# Patient Record
Sex: Female | Born: 1960 | Race: White | Hispanic: No | Marital: Married | Smoking: Former smoker
Health system: Southern US, Community
[De-identification: ages and names within clinical notes are randomized; demographics above are authoritative.]

## PROBLEM LIST (undated history)

## (undated) DIAGNOSIS — F419 Anxiety disorder, unspecified: Secondary | ICD-10-CM

## (undated) DIAGNOSIS — M1990 Unspecified osteoarthritis, unspecified site: Secondary | ICD-10-CM

## (undated) DIAGNOSIS — I2782 Chronic pulmonary embolism: Secondary | ICD-10-CM

## (undated) HISTORY — PX: APPENDECTOMY: SHX54

## (undated) HISTORY — PX: TONSILLECTOMY: SUR1361

## (undated) HISTORY — PX: PLACEMENT OF BREAST IMPLANTS: SHX6334

## (undated) HISTORY — PX: AUGMENTATION MAMMAPLASTY: SUR837

---

## 2013-03-16 LAB — HM PAP SMEAR: HM PAP: NEGATIVE

## 2014-05-27 ENCOUNTER — Ambulatory Visit (INDEPENDENT_AMBULATORY_CARE_PROVIDER_SITE_OTHER): Payer: BLUE CROSS/BLUE SHIELD

## 2014-05-27 ENCOUNTER — Ambulatory Visit (INDEPENDENT_AMBULATORY_CARE_PROVIDER_SITE_OTHER): Payer: BLUE CROSS/BLUE SHIELD | Admitting: Sports Medicine

## 2014-05-27 ENCOUNTER — Encounter: Payer: Self-pay | Admitting: Sports Medicine

## 2014-05-27 VITALS — BP 101/68 | HR 64 | Ht 68.0 in | Wt 169.0 lb

## 2014-05-27 DIAGNOSIS — M47896 Other spondylosis, lumbar region: Secondary | ICD-10-CM | POA: Diagnosis not present

## 2014-05-27 DIAGNOSIS — M1712 Unilateral primary osteoarthritis, left knee: Secondary | ICD-10-CM | POA: Insufficient documentation

## 2014-05-27 DIAGNOSIS — M51369 Other intervertebral disc degeneration, lumbar region without mention of lumbar back pain or lower extremity pain: Secondary | ICD-10-CM | POA: Insufficient documentation

## 2014-05-27 DIAGNOSIS — M11262 Other chondrocalcinosis, left knee: Secondary | ICD-10-CM

## 2014-05-27 DIAGNOSIS — M25562 Pain in left knee: Secondary | ICD-10-CM

## 2014-05-27 DIAGNOSIS — M5136 Other intervertebral disc degeneration, lumbar region: Secondary | ICD-10-CM

## 2014-05-27 DIAGNOSIS — M11261 Other chondrocalcinosis, right knee: Secondary | ICD-10-CM | POA: Diagnosis not present

## 2014-05-27 DIAGNOSIS — M170 Bilateral primary osteoarthritis of knee: Secondary | ICD-10-CM | POA: Insufficient documentation

## 2014-05-27 MED ORDER — IBUPROFEN 800 MG PO TABS: 800.0000 mg | ORAL_TABLET | Freq: Three times a day (TID) | ORAL | Status: DC | PRN

## 2014-05-27 NOTE — Assessment & Plan Note (Signed)
With discogenic low back pain. Ibuprofen, x-rays, formal PT.

## 2014-05-27 NOTE — Progress Notes (Signed)
   Subjective:    I'm seeing this patient as a consultation for:  Dr. Manson PasseyBrown  CC: Back and knee pain  HPI: Knee pain: Moderate, persistent, localized at the joint line, occasional mechanical symptoms, as well as pain anteriorly. She is a Engineer, civil (consulting)wedding venue owner, and has had some swelling for a while, she does desire interventional treatment today.   Low back pain: Moderate, persistent, discogenic without radiation.  Past medical history, Surgical history, Family history not pertinant except as noted below, Social history, Allergies, and medications have been entered into the medical record, reviewed, and no changes needed.   Review of Systems: No headache, visual changes, nausea, vomiting, diarrhea, constipation, dizziness, abdominal pain, skin rash, fevers, chills, night sweats, weight loss, swollen lymph nodes, body aches, joint swelling, muscle aches, chest pain, shortness of breath, mood changes, visual or auditory hallucinations.   Objective:   General: Well Developed, well nourished, and in no acute distress.  Neuro/Psych: Alert and oriented x3, extra-ocular muscles intact, able to move all 4 extremities, sensation grossly intact. Skin: Warm and dry, no rashes noted.  Respiratory: Not using accessory muscles, speaking in full sentences, trachea midline.  Cardiovascular: Pulses palpable, no extremity edema. Abdomen: Does not appear distended. Left Knee: Visibly swollen with a palpable fluid wave, pain on terminal flexion and with pain at the medial joint line. ROM normal in flexion and extension and lower leg rotation. Ligaments with solid consistent endpoints including ACL, PCL, LCL, MCL. Negative Mcmurray's and provocative meniscal tests. Non painful patellar compression. Patellar and quadriceps tendons unremarkable. Hamstring and quadriceps strength is normal.  Procedure: Real-time Ultrasound Guided Injection of left knee Device: GE Logiq E  Verbal informed consent obtained.    Time-out conducted.  Noted no overlying erythema, induration, or other signs of local infection.  Skin prepped in a sterile fashion.  Local anesthesia: Topical Ethyl chloride.  With sterile technique and under real time ultrasound guidance:  Aspirated 20 mL of straw-colored fluid, syringe switched and 2 mL kenalog 40, 4 mL lidocaine injected easily. Completed without difficulty  Pain immediately resolved suggesting accurate placement of the medication.  Advised to call if fevers/chills, erythema, induration, drainage, or persistent bleeding.  Images permanently stored and available for review in the ultrasound unit.  Impression: Technically successful ultrasound guided injection.  Impression and Recommendations:   This case required medical decision making of moderate complexity.

## 2014-05-27 NOTE — Assessment & Plan Note (Signed)
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 X-rays, ibuprofen, physical therapy, injection as above. Return for custom orthotics.

## 2014-06-10 ENCOUNTER — Encounter: Payer: Self-pay | Admitting: Sports Medicine

## 2014-06-10 ENCOUNTER — Ambulatory Visit (INDEPENDENT_AMBULATORY_CARE_PROVIDER_SITE_OTHER): Payer: 59 | Admitting: Sports Medicine

## 2014-06-10 VITALS — BP 101/70 | HR 63 | Ht 68.0 in | Wt 168.0 lb

## 2014-06-10 DIAGNOSIS — M51369 Other intervertebral disc degeneration, lumbar region without mention of lumbar back pain or lower extremity pain: Secondary | ICD-10-CM

## 2014-06-10 DIAGNOSIS — M5136 Other intervertebral disc degeneration, lumbar region: Secondary | ICD-10-CM | POA: Diagnosis not present

## 2014-06-10 DIAGNOSIS — M25562 Pain in left knee: Secondary | ICD-10-CM | POA: Diagnosis not present

## 2014-06-10 NOTE — Assessment & Plan Note (Signed)
Home rehab given.  return if no better in one month. MRI for intervention.

## 2014-06-10 NOTE — Progress Notes (Signed)

## 2014-06-10 NOTE — Assessment & Plan Note (Signed)
Improved significantly after injection at the last visit. Museum/gallery exhibitions officerCustom Orthotics as above.

## 2014-10-08 ENCOUNTER — Telehealth: Payer: Self-pay | Admitting: Sports Medicine

## 2014-10-08 ENCOUNTER — Encounter: Payer: Self-pay | Admitting: Sports Medicine

## 2014-10-08 ENCOUNTER — Ambulatory Visit (INDEPENDENT_AMBULATORY_CARE_PROVIDER_SITE_OTHER): Payer: 59 | Admitting: Sports Medicine

## 2014-10-08 VITALS — BP 100/69 | HR 75 | Wt 172.0 lb

## 2014-10-08 DIAGNOSIS — M1712 Unilateral primary osteoarthritis, left knee: Secondary | ICD-10-CM | POA: Diagnosis not present

## 2014-10-08 NOTE — Assessment & Plan Note (Signed)
Six-month response to previous injection. Repeat injection today, we will also get her set up for Orthovisc.

## 2014-10-08 NOTE — Telephone Encounter (Signed)
Submitted for approval on Orthovisc. Awaiting confirmation.  

## 2014-10-08 NOTE — Telephone Encounter (Signed)
-----   Message from Monica Becton, MD sent at 10/08/2014  2:12 PM EDT ----- Hello, can we get [facility] approved for Orthovisc, left knee. X-ray confirmed OA, failed steroids injection and NSAIDs. ___________________________________________ Ihor Austin. Benjamin Stain, M.D., ABFM., CAQSM. Primary Care and Sports Medicine [facility]  Adjunct Instructor of Family Medicine  [employer]

## 2014-10-08 NOTE — Progress Notes (Signed)
  Subjective:    CC: Left knee pain  HPI: Left knee osteoarthritis: Extremely well after steroidal injection 6 months ago, now with recurrence of medial joint line pain. Moderate, persistent without radiation, or mechanical symptoms. No swelling. No trauma. Desires repeat intervention.  Past medical history, Surgical history, Family history not pertinant except as noted below, Social history, Allergies, and medications have been entered into the medical record, reviewed, and no changes needed.   Review of Systems: No fevers, chills, night sweats, weight loss, chest pain, or shortness of breath.   Objective:    General: Well Developed, well nourished, and in no acute distress.  Neuro: Alert and oriented x3, extra-ocular muscles intact, sensation grossly intact.  HEENT: Normocephalic, atraumatic, pupils equal round reactive to light, neck supple, no masses, no lymphadenopathy, thyroid nonpalpable.  Skin: Warm and dry, no rashes. Cardiac: Regular rate and rhythm, no murmurs rubs or gallops, no lower extremity edema.  Respiratory: Clear to auscultation bilaterally. Not using accessory muscles, speaking in full sentences. Left Knee: Normal to inspection with no erythema or effusion or obvious bony abnormalities. Tender to palpation at the medial joint line ROM normal in flexion and extension and lower leg rotation. Ligaments with solid consistent endpoints including ACL, PCL, LCL, MCL. Negative Mcmurray's and provocative meniscal tests. Non painful patellar compression. Patellar and quadriceps tendons unremarkable. Hamstring and quadriceps strength is normal.  Procedure: Real-time Ultrasound Guided Injection of left knee Device: GE Logiq E  Verbal informed consent obtained.  Time-out conducted.  Noted no overlying erythema, induration, or other signs of local infection.  Skin prepped in a sterile fashion.  Local anesthesia: Topical Ethyl chloride.  With sterile technique and under real  time ultrasound guidance:  2 mL kenalog 40, 4 mL lidocaine injected easily Completed without difficulty  Pain immediately resolved suggesting accurate placement of the medication.  Advised to call if fevers/chills, erythema, induration, drainage, or persistent bleeding.  Images permanently stored and available for review in the ultrasound unit.  Impression: Technically successful ultrasound guided injection.  Impression and Recommendations:

## 2014-10-12 NOTE — Telephone Encounter (Signed)
Received information back from [facility]:  PATIENT HAS [facility] EXCHANGE HMO PLAN WITH AN EFFECTIVE DATE OF 03/30/2014. SUBMIT PCP REFERRAL FOR INITIAL VISIT WITH CLAIM. MEDICAL NOTES MUST BE SUBMITTED WITH THE CLAIM. INCLUDE MEDICAL NECESSITY, DIAGNOSIS, AND ALL CLINICAL. DOCTOR MAY NOT BUY AND BILL, MEDICATION MUST BE OBTAINED THROUGH THE SPECIALTY PHARMACY. PLEASE CONTACT THE ORTHOVISC LINE IF YOU WOULD LIKE MEDICATION TRANSFERRED TO SPECIALTY PHARMACY. [id number] IS COVERED AT 70% OF THE CONTRACTED RATE WHEN PERFORMED IN AN OFFICE SETTING. *DEDUCTIBLE MUST BE MET FOR COVERAGE TO APPLY. IF OUT OF POCKET IS MET, COVERAGE GOES TO 100%. Reference: [id number]  [facility] has received the Rx request and is processing it. Will contact our office with any PA requirements.  Medical notes have been faxed to [employer] at F: [phone]. Will await pre-determination.

## 2014-10-21 NOTE — Telephone Encounter (Addendum)
Received fax from [employer] requesting CPT codes. The following codes will be associated with each injection administration (Left knee only = 4 total).  CPT code: 20610 Rx code: [id number] DX: M17.12  Will fax information to [employer]. 

## 2014-12-10 ENCOUNTER — Ambulatory Visit (INDEPENDENT_AMBULATORY_CARE_PROVIDER_SITE_OTHER): Payer: 59 | Admitting: Sports Medicine

## 2014-12-10 ENCOUNTER — Encounter: Payer: Self-pay | Admitting: Sports Medicine

## 2014-12-10 VITALS — BP 104/65 | HR 63 | Wt 171.0 lb

## 2014-12-10 DIAGNOSIS — F39 Unspecified mood [affective] disorder: Secondary | ICD-10-CM

## 2014-12-10 DIAGNOSIS — Z0000 Encounter for general adult medical examination without abnormal findings: Secondary | ICD-10-CM | POA: Diagnosis not present

## 2014-12-10 DIAGNOSIS — M1712 Unilateral primary osteoarthritis, left knee: Secondary | ICD-10-CM

## 2014-12-10 MED ORDER — MELOXICAM 15 MG PO TABS: ORAL_TABLET | ORAL | Status: DC

## 2014-12-10 MED ORDER — SERTRALINE HCL 25 MG PO TABS: 25.0000 mg | ORAL_TABLET | Freq: Every day | ORAL | Status: DC

## 2014-12-10 MED ORDER — TRAMADOL HCL 50 MG PO TABS: ORAL_TABLET | ORAL | Status: DC

## 2014-12-10 NOTE — Progress Notes (Signed)
  Subjective:    CC: Establish care  HPI: Preventive measures: Up-to-date on cervical, breast, colon cancer screening, needs routine blood work.  Left knee osteoarthritis: Minimal response to the previous injection, Orthovisc was not affordable, has not tried any NSAIDs.  Mood disorder: stable on Zoloft, needs a refill.  Past medical history, Surgical history, Family history not pertinant except as noted below, Social history, Allergies, and medications have been entered into the medical record, reviewed, and no changes needed.   Review of Systems: No fevers, chills, night sweats, weight loss, chest pain, or shortness of breath.   Objective:    General: Well Developed, well nourished, and in no acute distress.  Neuro: Alert and oriented x3, extra-ocular muscles intact, sensation grossly intact.  HEENT: Normocephalic, atraumatic, pupils equal round reactive to light, neck supple, no masses, no lymphadenopathy, thyroid nonpalpable.  Skin: Warm and dry, no rashes. Cardiac: Regular rate and rhythm, no murmurs rubs or gallops, no lower extremity edema.  Respiratory: Clear to auscultation bilaterally. Not using accessory muscles, speaking in full sentences.  Impression and Recommendations:    I spent 25 minutes with this patient, greater than 50% was face-to-face time counseling regarding the above diagnoses

## 2014-12-10 NOTE — Assessment & Plan Note (Signed)
Previous injection was in September, now starting to have a recurrence of pain, not taking any NSAIDs, adding meloxicam. Visco supplementation with Orthovisc is approved but only after deductible is met.

## 2014-12-10 NOTE — Assessment & Plan Note (Signed)
Continue sertraline 

## 2014-12-10 NOTE — Assessment & Plan Note (Signed)
Ordering routine blood work. Up-to-date on cervical, breast, colon cancer screening.

## 2014-12-13 ENCOUNTER — Telehealth: Payer: Self-pay | Admitting: Sports Medicine

## 2014-12-13 DIAGNOSIS — F39 Unspecified mood [affective] disorder: Secondary | ICD-10-CM

## 2014-12-13 MED ORDER — SERTRALINE HCL 25 MG PO TABS: 25.0000 mg | ORAL_TABLET | Freq: Every day | ORAL | Status: DC

## 2014-12-13 NOTE — Telephone Encounter (Signed)
Please let Vanessa DandyMary know that [facility] cant fill her scripts until she signs up for mail order.  Then I can start sending it there, until then she needs to simply get her zoloft from her local pharmacy.

## 2014-12-13 NOTE — Telephone Encounter (Signed)
Pt advised. States she is fine to pick up Rx at local pharmacy for now. Advised to let us know if she signs up for mail order and we can adjust future refills accordingly. No further questions.

## 2015-03-02 ENCOUNTER — Other Ambulatory Visit: Payer: Self-pay | Admitting: Sports Medicine

## 2015-03-02 DIAGNOSIS — Z1239 Encounter for other screening for malignant neoplasm of breast: Secondary | ICD-10-CM

## 2015-03-11 ENCOUNTER — Telehealth: Payer: Self-pay | Admitting: Sports Medicine

## 2015-03-11 DIAGNOSIS — W57XXXA Bitten or stung by nonvenomous insect and other nonvenomous arthropods, initial encounter: Secondary | ICD-10-CM

## 2015-03-11 DIAGNOSIS — Z8619 Personal history of other infectious and parasitic diseases: Secondary | ICD-10-CM | POA: Insufficient documentation

## 2015-03-11 MED ORDER — DOXYCYCLINE HYCLATE 100 MG PO TABS: 100.0000 mg | ORAL_TABLET | Freq: Two times a day (BID) | ORAL | Status: DC

## 2015-03-11 NOTE — Telephone Encounter (Signed)
Pt is visiting her sister out of town and had a deer tick removed this am. Pt unsure how long it was attached but it was embedded under the skin. There is a small red ring around the area. No current fever. Pt does report a headache. Pt questions if she should start on an antibiotic, will route to PCP for review. Pt would like to use [facility] on [address], [place] since she is out of town.

## 2015-03-11 NOTE — Telephone Encounter (Signed)
Adding 14 days of doxycycline, also placed orders for [facility], Lyme, and human erlichiosis titers for when she gets back.

## 2015-03-11 NOTE — Telephone Encounter (Signed)
Pt advised of new Rx and lab orders. States she will start the Rx today and she will get her labs done next week when she gets home.

## 2015-03-15 ENCOUNTER — Ambulatory Visit (INDEPENDENT_AMBULATORY_CARE_PROVIDER_SITE_OTHER): Payer: 59 | Admitting: Sports Medicine

## 2015-03-15 ENCOUNTER — Encounter: Payer: Self-pay | Admitting: Sports Medicine

## 2015-03-15 VITALS — BP 128/88 | HR 87 | Temp 97.7°F | Resp 18 | Wt 173.2 lb

## 2015-03-15 DIAGNOSIS — W57XXXA Bitten or stung by nonvenomous insect and other nonvenomous arthropods, initial encounter: Secondary | ICD-10-CM | POA: Diagnosis not present

## 2015-03-15 DIAGNOSIS — T148 Other injury of unspecified body region: Secondary | ICD-10-CM

## 2015-03-15 LAB — CBC WITH DIFFERENTIAL/PLATELET
Basophils Absolute: 0 K/uL (ref 0.0–0.1)
Basophils Relative: 1 % (ref 0–1)
Eosinophils Absolute: 0 10*3/uL (ref 0.0–0.7)
Eosinophils Relative: 1 % (ref 0–5)
HCT: 38.4 % (ref 36.0–46.0)
Hemoglobin: 12.8 g/dL (ref 12.0–15.0)
Lymphocytes Relative: 45 % (ref 12–46)
Lymphs Abs: 1.8 K/uL (ref 0.7–4.0)
MCH: 31 pg (ref 26.0–34.0)
MCHC: 33.3 g/dL (ref 30.0–36.0)
MCV: 93 fL (ref 78.0–100.0)
MPV: 10.2 fL (ref 8.6–12.4)
Monocytes Absolute: 0.4 10*3/uL (ref 0.1–1.0)
Monocytes Relative: 9 % (ref 3–12)
Neutro Abs: 1.7 K/uL (ref 1.7–7.7)
Neutrophils Relative %: 44 % (ref 43–77)
Platelets: 229 K/uL (ref 150–400)
RBC: 4.13 MIL/uL (ref 3.87–5.11)
RDW: 14.4 % (ref 11.5–15.5)
WBC: 3.9 10*3/uL — ABNORMAL LOW (ref 4.0–10.5)

## 2015-03-15 LAB — COMPREHENSIVE METABOLIC PANEL WITH GFR
AST: 24 U/L (ref 10–35)
CO2: 29 mmol/L (ref 20–31)
Calcium: 9.4 mg/dL (ref 8.6–10.4)
Chloride: 104 mmol/L (ref 98–110)
Glucose, Bld: 96 mg/dL (ref 65–99)
Sodium: 143 mmol/L (ref 135–146)
Total Bilirubin: 0.4 mg/dL (ref 0.2–1.2)

## 2015-03-15 LAB — COMPREHENSIVE METABOLIC PANEL
ALT: 28 U/L (ref 6–29)
Albumin: 4.3 g/dL (ref 3.6–5.1)
Alkaline Phosphatase: 61 U/L (ref 33–130)
BUN: 14 mg/dL (ref 7–25)
Creat: 0.6 mg/dL (ref 0.50–1.05)
Potassium: 4 mmol/L (ref 3.5–5.3)
Total Protein: 6.7 g/dL (ref 6.1–8.1)

## 2015-03-15 LAB — LYME AB/WESTERN BLOT REFLEX: B burgdorferi Ab IgG+IgM: 0.53 {ISR}

## 2015-03-15 MED ORDER — DOXYCYCLINE HYCLATE 100 MG PO TABS: 100.0000 mg | ORAL_TABLET | Freq: Two times a day (BID) | ORAL | Status: AC

## 2015-03-15 NOTE — Progress Notes (Signed)
  Subjective:    CC: tick bite  HPI: Couple weeks ago this pleasant 55 year old female had a tick removed from her right posterior back, she developed a rash, and possibly some myalgias, we started her on doxycycline and added various tickborne titers. Overall she feels better. Symptoms are mild, improving  Past medical history, Surgical history, Family history not pertinant except as noted below, Social history, Allergies, and medications have been entered into the medical record, reviewed, and no changes needed.   Review of Systems: No fevers, chills, night sweats, weight loss, chest pain, or shortness of breath.   Objective:    General: Well Developed, well nourished, and in no acute distress.  Neuro: Alert and oriented x3, extra-ocular muscles intact, sensation grossly intact.  HEENT: Normocephalic, atraumatic, pupils equal round reactive to light, neck supple, no masses, no lymphadenopathy, thyroid nonpalpable.  Skin: Warm and dry, no rashes.there is a small superficially ulcerated papule with mild surrounding erythema on the right side of her back without any evidence of a erythema chronicum migrans lesion. Cardiac: Regular rate and rhythm, no murmurs rubs or gallops, no lower extremity edema.  Respiratory: Clear to auscultation bilaterally. Not using accessory muscles, speaking in full sentences.  Impression and Recommendations:

## 2015-03-15 NOTE — Assessment & Plan Note (Signed)
Finishing doxycycline, Lyme titers are negative, awaiting [facility] and human erlichiosis titers.

## 2015-03-16 LAB — EHRLICHIA ANTIBODY PANEL
E chaffeensis (HGE) Ab, IgG: <1:64 {titer}
E chaffeensis (HGE) Ab, IgM: <1:20 {titer}

## 2015-03-23 ENCOUNTER — Telehealth: Payer: Self-pay | Admitting: Sports Medicine

## 2015-03-23 LAB — ROCKY MTN SPOTTED FVR ABS PNL(IGG+IGM)
RMSF IgG: 0.55 IV
RMSF IgM: 2.22 IV

## 2015-03-23 NOTE — Telephone Encounter (Signed)
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

## 2015-03-24 ENCOUNTER — Encounter: Payer: Self-pay | Admitting: Sports Medicine

## 2015-03-30 ENCOUNTER — Ambulatory Visit (INDEPENDENT_AMBULATORY_CARE_PROVIDER_SITE_OTHER): Payer: 59

## 2015-03-30 DIAGNOSIS — Z1231 Encounter for screening mammogram for malignant neoplasm of breast: Secondary | ICD-10-CM

## 2015-03-30 DIAGNOSIS — Z1239 Encounter for other screening for malignant neoplasm of breast: Secondary | ICD-10-CM

## 2015-04-12 ENCOUNTER — Other Ambulatory Visit: Payer: Self-pay | Admitting: Sports Medicine

## 2015-08-04 ENCOUNTER — Encounter: Payer: Self-pay | Admitting: Sports Medicine

## 2015-08-04 ENCOUNTER — Ambulatory Visit (INDEPENDENT_AMBULATORY_CARE_PROVIDER_SITE_OTHER): Payer: BLUE CROSS/BLUE SHIELD | Admitting: Sports Medicine

## 2015-08-04 VITALS — BP 104/70 | HR 62 | Ht 68.0 in | Wt 177.0 lb

## 2015-08-04 DIAGNOSIS — M1712 Unilateral primary osteoarthritis, left knee: Secondary | ICD-10-CM

## 2015-08-04 NOTE — Progress Notes (Signed)
  Subjective:    CC: left knee pain  HPI: His is a pleasant 55 year old female with known left knee osteoarthritis, previous injection provided 10 months of relief,desires repeat injection, pain is moderate, persistent, localized at the joint line without mechanical symptoms, only minimal swelling.  Past medical history, Surgical history, Family history not pertinant except as noted below, Social history, Allergies, and medications have been entered into the medical record, reviewed, and no changes needed.   Review of Systems: No fevers, chills, night sweats, weight loss, chest pain, or shortness of breath.   Objective:    General: Well Developed, well nourished, and in no acute distress.  Neuro: Alert and oriented x3, extra-ocular muscles intact, sensation grossly intact.  HEENT: Normocephalic, atraumatic, pupils equal round reactive to light, neck supple, no masses, no lymphadenopathy, thyroid nonpalpable.  Skin: Warm and dry, no rashes. Cardiac: Regular rate and rhythm, no murmurs rubs or gallops, no lower extremity edema.  Respiratory: Clear to auscultation bilaterally. Not using accessory muscles, speaking in full sentences. Left Knee: Normal to inspection with no erythema or effusion or obvious bony abnormalities. Tender to palpation at the medial joint line ROM normal in flexion and extension and lower leg rotation. Ligaments with solid consistent endpoints including ACL, PCL, LCL, MCL. Negative Mcmurray's and provocative meniscal tests. Non painful patellar compression. Patellar and quadriceps tendons unremarkable. Hamstring and quadriceps strength is normal.  Procedure: Real-time Ultrasound Guided Injection of left knee Device: GE Logiq E  Verbal informed consent obtained.  Time-out conducted.  Noted no overlying erythema, induration, or other signs of local infection.  Skin prepped in a sterile fashion.  Local anesthesia: Topical Ethyl chloride.  With sterile technique  and under real time ultrasound guidance:  1 mL kenalog 40, 2 mL lidocaine, 2 mL Marcaine injected easily into the suprapatellar recess. Completed without difficulty  Pain immediately resolved suggesting accurate placement of the medication.  Advised to call if fevers/chills, erythema, induration, drainage, or persistent bleeding.  Images permanently stored and available for review in the ultrasound unit.  Impression: Technically successful ultrasound guided injection.  Impression and Recommendations:

## 2015-08-04 NOTE — Assessment & Plan Note (Signed)
5817-month response to previous injection, repeat steroid injection today, return as needed.

## 2015-08-14 ENCOUNTER — Other Ambulatory Visit: Payer: Self-pay | Admitting: Sports Medicine

## 2015-09-19 ENCOUNTER — Other Ambulatory Visit: Payer: Self-pay

## 2015-09-19 DIAGNOSIS — F39 Unspecified mood [affective] disorder: Secondary | ICD-10-CM

## 2015-09-19 MED ORDER — SERTRALINE HCL 25 MG PO TABS: 25.0000 mg | ORAL_TABLET | Freq: Every day | ORAL | 3 refills | Status: DC

## 2015-09-19 MED ORDER — MELOXICAM 15 MG PO TABS: 15.0000 mg | ORAL_TABLET | Freq: Every day | ORAL | 1 refills | Status: DC

## 2015-10-21 ENCOUNTER — Telehealth: Payer: Self-pay

## 2015-10-21 MED ORDER — PREDNISONE 50 MG PO TABS: ORAL_TABLET | ORAL | 0 refills | Status: DC

## 2015-10-21 MED ORDER — CYCLOBENZAPRINE HCL 10 MG PO TABS: ORAL_TABLET | ORAL | 0 refills | Status: DC

## 2015-10-21 NOTE — Telephone Encounter (Signed)
Sending in 5 days of prednisone and Flexeril.

## 2015-10-21 NOTE — Telephone Encounter (Signed)
Vanessa Snyder called and states she was moving heavy rocks and now she has back muscle pain. She is working all weekend on a wedding and [facility]. Please advise.

## 2015-10-24 NOTE — Telephone Encounter (Signed)
Patient aware.

## 2016-03-05 ENCOUNTER — Other Ambulatory Visit: Payer: Self-pay | Admitting: Sports Medicine

## 2016-03-05 DIAGNOSIS — Z1231 Encounter for screening mammogram for malignant neoplasm of breast: Secondary | ICD-10-CM

## 2016-03-22 ENCOUNTER — Ambulatory Visit: Payer: BLUE CROSS/BLUE SHIELD | Admitting: Sports Medicine

## 2016-03-23 ENCOUNTER — Other Ambulatory Visit: Payer: Self-pay | Admitting: Sports Medicine

## 2016-04-06 ENCOUNTER — Ambulatory Visit (INDEPENDENT_AMBULATORY_CARE_PROVIDER_SITE_OTHER): Payer: BLUE CROSS/BLUE SHIELD | Admitting: Sports Medicine

## 2016-04-06 ENCOUNTER — Ambulatory Visit (INDEPENDENT_AMBULATORY_CARE_PROVIDER_SITE_OTHER): Payer: BLUE CROSS/BLUE SHIELD

## 2016-04-06 ENCOUNTER — Encounter: Payer: Self-pay | Admitting: Sports Medicine

## 2016-04-06 DIAGNOSIS — Z1231 Encounter for screening mammogram for malignant neoplasm of breast: Secondary | ICD-10-CM | POA: Diagnosis not present

## 2016-04-06 DIAGNOSIS — Z01419 Encounter for gynecological examination (general) (routine) without abnormal findings: Secondary | ICD-10-CM | POA: Diagnosis not present

## 2016-04-06 DIAGNOSIS — Z0000 Encounter for general adult medical examination without abnormal findings: Secondary | ICD-10-CM | POA: Diagnosis not present

## 2016-04-06 DIAGNOSIS — F39 Unspecified mood [affective] disorder: Secondary | ICD-10-CM

## 2016-04-06 DIAGNOSIS — M1712 Unilateral primary osteoarthritis, left knee: Secondary | ICD-10-CM | POA: Diagnosis not present

## 2016-04-06 DIAGNOSIS — Z6828 Body mass index (BMI) 28.0-28.9, adult: Secondary | ICD-10-CM | POA: Diagnosis not present

## 2016-04-06 DIAGNOSIS — R1310 Dysphagia, unspecified: Secondary | ICD-10-CM | POA: Diagnosis not present

## 2016-04-06 LAB — HM PAP SMEAR

## 2016-04-06 MED ORDER — SERTRALINE HCL 25 MG PO TABS: 25.0000 mg | ORAL_TABLET | Freq: Every day | ORAL | 3 refills | Status: DC

## 2016-04-06 NOTE — Assessment & Plan Note (Signed)
With globus sensation, unclear etiology, sensation of food getting stuck. Referral to gastroenterology for consideration of swallowing studies as well as upper endoscopy.

## 2016-04-06 NOTE — Assessment & Plan Note (Signed)
Extremely well-controlled, refilling Zoloft.

## 2016-04-06 NOTE — Assessment & Plan Note (Signed)
Very well controlled after injection several months ago, only takes 7.5 mg of meloxicam daily.

## 2016-04-06 NOTE — Assessment & Plan Note (Signed)
Up-to-date on screening measures, checking routine blood work.

## 2016-04-06 NOTE — Progress Notes (Signed)
  Subjective:    CC: Follow-up  HPI: This is a pleasant 56 year old female, she is here for follow-up of multiple issues.  Depression: Well controlled with 25 of sertraline.  Knee osteoarthritis: Controlled after injection and 7.5 mg of meloxicam daily.  Dysphagia/globus sensation: Feels as though something is stuck in her throat when she swallows. Minimal halitosis, no sour brash, no weight loss. No hematochezia, melena, hematemesis.  Past medical history:  Negative.  See flowsheet/record as well for more information.  Surgical history: Negative.  See flowsheet/record as well for more information.  Family history: Negative.  See flowsheet/record as well for more information.  Social history: Negative.  See flowsheet/record as well for more information.  Allergies, and medications have been entered into the medical record, reviewed, and no changes needed.   Review of Systems: No fevers, chills, night sweats, weight loss, chest pain, or shortness of breath.   Objective:    General: Well Developed, well nourished, and in no acute distress.  Neuro: Alert and oriented x3, extra-ocular muscles intact, sensation grossly intact.  HEENT: Normocephalic, atraumatic, pupils equal round reactive to light, neck supple, no masses, no lymphadenopathy, thyroid nonpalpable.  Skin: Warm and dry, no rashes. Cardiac: Regular rate and rhythm, no murmurs rubs or gallops, no lower extremity edema.  Respiratory: Clear to auscultation bilaterally. Not using accessory muscles, speaking in full sentences. Abdomen: Soft, nontender, nondistended, normal bowel sounds, no palpable masses, no guarding, rigidity, rebound tenderness.  Impression and Recommendations:    Annual physical exam Up-to-date on screening measures, checking routine blood work.  Mood disorder (HCC) Extremely well-controlled, refilling Zoloft.  Primary osteoarthritis of left knee Very well controlled after injection several months ago,  only takes 7.5 mg of meloxicam daily.  Dysphagia With globus sensation, unclear etiology, sensation of food getting stuck. Referral to gastroenterology for consideration of swallowing studies as well as upper endoscopy.

## 2016-04-09 ENCOUNTER — Encounter: Payer: Self-pay | Admitting: Sports Medicine

## 2016-04-26 DIAGNOSIS — Z0000 Encounter for general adult medical examination without abnormal findings: Secondary | ICD-10-CM | POA: Diagnosis not present

## 2016-04-26 LAB — CBC
HCT: 40.7 % (ref 35.0–45.0)
Hemoglobin: 13.1 g/dL (ref 11.7–15.5)
MCH: 31.1 pg (ref 27.0–33.0)
MCHC: 32.2 g/dL (ref 32.0–36.0)
MCV: 96.7 fL (ref 80.0–100.0)
MPV: 10.1 fL (ref 7.5–12.5)
Platelets: 296 10*3/uL (ref 140–400)
RBC: 4.21 MIL/uL (ref 3.80–5.10)
RDW: 13.8 % (ref 11.0–15.0)
WBC: 3.9 10*3/uL (ref 3.8–10.8)

## 2016-04-27 LAB — COMPREHENSIVE METABOLIC PANEL WITH GFR
ALT: 25 U/L (ref 6–29)
AST: 24 U/L (ref 10–35)
Alkaline Phosphatase: 61 U/L (ref 33–130)
BUN: 12 mg/dL (ref 7–25)
Chloride: 104 mmol/L (ref 98–110)
Creat: 0.67 mg/dL (ref 0.50–1.05)
Sodium: 142 mmol/L (ref 135–146)
Total Protein: 6.8 g/dL (ref 6.1–8.1)

## 2016-04-27 LAB — LIPID PANEL W/REFLEX DIRECT LDL
Cholesterol: 175 mg/dL (ref <200)
HDL: 70 mg/dL (ref >50)
LDL-Cholesterol: 92 mg/dL
Non-HDL Cholesterol (Calc): 105 mg/dL (ref <130)
Total CHOL/HDL Ratio: 2.5 Ratio (ref <5.0)
Triglycerides: 51 mg/dL (ref <150)

## 2016-04-27 LAB — TSH: TSH: 1.31 mIU/L

## 2016-04-27 LAB — VITAMIN D 25 HYDROXY (VIT D DEFICIENCY, FRACTURES): Vit D, 25-Hydroxy: 39 ng/mL (ref 30–100)

## 2016-04-27 LAB — COMPREHENSIVE METABOLIC PANEL
Albumin: 4.1 g/dL (ref 3.6–5.1)
CO2: 28 mmol/L (ref 20–31)
Calcium: 9.4 mg/dL (ref 8.6–10.4)
Glucose, Bld: 86 mg/dL (ref 65–99)
Potassium: 4.3 mmol/L (ref 3.5–5.3)
Total Bilirubin: 0.5 mg/dL (ref 0.2–1.2)

## 2016-04-27 LAB — HIV ANTIBODY (ROUTINE TESTING W REFLEX): HIV 1&2 Ab, 4th Generation: NONREACTIVE

## 2016-04-27 LAB — HEMOGLOBIN A1C
Hgb A1c MFr Bld: 5.1 % (ref <5.7)
Mean Plasma Glucose: 100 mg/dL

## 2016-04-27 LAB — HEPATITIS C ANTIBODY: HCV Ab: NEGATIVE

## 2016-09-10 DIAGNOSIS — H524 Presbyopia: Secondary | ICD-10-CM | POA: Diagnosis not present

## 2016-09-16 ENCOUNTER — Other Ambulatory Visit: Payer: Self-pay | Admitting: Sports Medicine

## 2016-12-14 ENCOUNTER — Other Ambulatory Visit: Payer: Self-pay | Admitting: Sports Medicine

## 2017-02-26 ENCOUNTER — Other Ambulatory Visit: Payer: Self-pay | Admitting: Sports Medicine

## 2017-02-26 DIAGNOSIS — Z1239 Encounter for other screening for malignant neoplasm of breast: Secondary | ICD-10-CM

## 2017-04-19 ENCOUNTER — Other Ambulatory Visit: Payer: Self-pay | Admitting: Sports Medicine

## 2017-04-19 ENCOUNTER — Ambulatory Visit (INDEPENDENT_AMBULATORY_CARE_PROVIDER_SITE_OTHER): Payer: BLUE CROSS/BLUE SHIELD

## 2017-04-19 DIAGNOSIS — Z1231 Encounter for screening mammogram for malignant neoplasm of breast: Secondary | ICD-10-CM | POA: Diagnosis not present

## 2017-04-19 DIAGNOSIS — Z1239 Encounter for other screening for malignant neoplasm of breast: Secondary | ICD-10-CM

## 2017-04-19 DIAGNOSIS — Z6827 Body mass index (BMI) 27.0-27.9, adult: Secondary | ICD-10-CM | POA: Diagnosis not present

## 2017-04-19 DIAGNOSIS — Z01419 Encounter for gynecological examination (general) (routine) without abnormal findings: Secondary | ICD-10-CM | POA: Diagnosis not present

## 2017-06-05 ENCOUNTER — Ambulatory Visit (INDEPENDENT_AMBULATORY_CARE_PROVIDER_SITE_OTHER): Payer: BLUE CROSS/BLUE SHIELD | Admitting: Sports Medicine

## 2017-06-05 DIAGNOSIS — R1310 Dysphagia, unspecified: Secondary | ICD-10-CM

## 2017-06-05 DIAGNOSIS — Z0000 Encounter for general adult medical examination without abnormal findings: Secondary | ICD-10-CM

## 2017-06-05 DIAGNOSIS — Z8619 Personal history of other infectious and parasitic diseases: Secondary | ICD-10-CM | POA: Diagnosis not present

## 2017-06-05 DIAGNOSIS — M1712 Unilateral primary osteoarthritis, left knee: Secondary | ICD-10-CM | POA: Diagnosis not present

## 2017-06-05 DIAGNOSIS — F39 Unspecified mood [affective] disorder: Secondary | ICD-10-CM | POA: Diagnosis not present

## 2017-06-05 MED ORDER — DICLOFENAC SODIUM 2 % TD SOLN: 2.0000 | Freq: Two times a day (BID) | TRANSDERMAL | 11 refills | Status: DC

## 2017-06-05 MED ORDER — COSAMIN ASU ADVANCED FORMULA PO CAPS: 2.0000 | ORAL_CAPSULE | Freq: Two times a day (BID) | ORAL | 11 refills | Status: DC

## 2017-06-05 NOTE — Assessment & Plan Note (Signed)
Previous injection was over 1 year ago, repeat bilateral injections. She is only taking one half of the meloxicam tab, she can go up to a full tab. Return as needed for this.

## 2017-06-05 NOTE — Progress Notes (Signed)
Subjective:    CC: Bilateral knee pain  HPI: Vanessa Snyder is a pleasant 57 year old female, she has known knee osteoarthritis, previous injection was over a year ago, having a recurrence of pain, only doing half of the meloxicam.  She desires repeat injections, pain is moderate, persistent, localized to the medial joint lines without radiation, no mechanical symptoms, no trauma.  In addition she is noted increase in her fatigue, she does have a history of depression but feels happy, she sleeps well and wakes well rested.  Did have a few additional tick bites.  Would like some routine blood work.  Last but not least she has noted some difficulty swallowing, mostly solid foods, she does tend to take large bites and not chew appropriately.  Last year we did a referral to gastroenterology, she did not follow through with this.  She understands that if with changing her eating habits, smaller bites, better chewing, taking each bite with water if this does not help we will proceed with modified barium swallow.  I reviewed the past medical history, family history, social history, surgical history, and allergies today and no changes were needed.  Please see the problem list section below in epic for further details.  Past Medical History: No past medical history on file. Past Surgical History: Past Surgical History:  Procedure Laterality Date  . AUGMENTATION MAMMAPLASTY    . CESAREAN SECTION    . PLACEMENT OF BREAST IMPLANTS     Social History: Social History   Socioeconomic History  . Marital status: Married    Spouse name: Not on file  . Number of children: Not on file  . Years of education: Not on file  . Highest education level: Not on file  Occupational History  . Not on file  Social Needs  . Financial resource strain: Not on file  . Food insecurity:    Worry: Not on file    Inability: Not on file  . Transportation needs:    Medical: Not on file    Non-medical: Not on file  Tobacco  Use  . Smoking status: Never Smoker  . Smokeless tobacco: Never Used  Substance and Sexual Activity  . Alcohol use: Not on file  . Drug use: Not on file  . Sexual activity: Not on file  Lifestyle  . Physical activity:    Days per week: Not on file    Minutes per session: Not on file  . Stress: Not on file  Relationships  . Social connections:    Talks on phone: Not on file    Gets together: Not on file    Attends religious service: Not on file    Active member of club or organization: Not on file    Attends meetings of clubs or organizations: Not on file    Relationship status: Not on file  Other Topics Concern  . Not on file  Social History Narrative  . Not on file   Family History: No family history on file. Allergies: No Known Allergies Medications: See med rec.  Review of Systems: No fevers, chills, night sweats, weight loss, chest pain, or shortness of breath.   Objective:    General: Well Developed, well nourished, and in no acute distress.  Neuro: Alert and oriented x3, extra-ocular muscles intact, sensation grossly intact.  HEENT: Normocephalic, atraumatic, pupils equal round reactive to light, neck supple, no masses, no lymphadenopathy, thyroid nonpalpable.  Skin: Warm and dry, no rashes. Cardiac: Regular rate and rhythm, no murmurs rubs  or gallops, no lower extremity edema.  Respiratory: Clear to auscultation bilaterally. Not using accessory muscles, speaking in full sentences.  Procedure: Real-time Ultrasound Guided Injection of left knee Device: GE Logiq E  Verbal informed consent obtained.  Time-out conducted.  Noted no overlying erythema, induration, or other signs of local infection.  Skin prepped in a sterile fashion.  Local anesthesia: Topical Ethyl chloride.  With sterile technique and under real time ultrasound guidance: 1 cc Kenalog 40, 2 cc lidocaine, 2 cc bupivacaine injected easily Completed without difficulty  Pain immediately resolved  suggesting accurate placement of the medication.  Advised to call if fevers/chills, erythema, induration, drainage, or persistent bleeding.  Images permanently stored and available for review in the ultrasound unit.  Impression: Technically successful ultrasound guided injection.  Procedure: Real-time Ultrasound Guided Injection of right knee Device: GE Logiq E  Verbal informed consent obtained.  Time-out conducted.  Noted no overlying erythema, induration, or other signs of local infection.  Skin prepped in a sterile fashion.  Local anesthesia: Topical Ethyl chloride.  With sterile technique and under real time ultrasound guidance: 1 cc Kenalog 40, 2 cc lidocaine, 2 cc bupivacaine injected easily Completed without difficulty  Pain immediately resolved suggesting accurate placement of the medication.  Advised to call if fevers/chills, erythema, induration, drainage, or persistent bleeding.  Images permanently stored and available for review in the ultrasound unit.  Impression: Technically successful ultrasound guided injection.  Impression and Recommendations:    Primary osteoarthritis of left knee Previous injection was over 1 year ago, repeat bilateral injections. She is only taking one half of the meloxicam tab, she can go up to a full tab. Return as needed for this.  H/O [facility] spotted fever Increasing fatigue, rechecking tickborne serologies.  Dysphagia Continues to have difficulty swallowing, sensation of food getting stuck. She will try smaller bites, water mixed in. If persistent difficulty swallowing we will refer her to speech-language pathology and gastroenterology. We did discuss this 1 year ago.  Mood disorder (HCC) Continues to do well on Zoloft  Annual physical exam Rechecking routine labs  ___________________________________________ Ihor Austin. Benjamin Stain, M.D., ABFM., CAQSM. Primary Care and Sports Medicine [facility]  [facility]  Adjunct Instructor of Family Medicine  [employer]

## 2017-06-05 NOTE — Assessment & Plan Note (Signed)
Continues to do well on Zoloft

## 2017-06-05 NOTE — Assessment & Plan Note (Signed)
Increasing fatigue, rechecking tickborne serologies.

## 2017-06-05 NOTE — Assessment & Plan Note (Signed)
Rechecking routine labs. 

## 2017-06-05 NOTE — Assessment & Plan Note (Signed)
Continues to have difficulty swallowing, sensation of food getting stuck. She will try smaller bites, water mixed in. If persistent difficulty swallowing we will refer her to speech-language pathology and gastroenterology. We did discuss this 1 year ago.

## 2017-06-10 ENCOUNTER — Other Ambulatory Visit: Payer: Self-pay | Admitting: Sports Medicine

## 2017-06-10 DIAGNOSIS — F39 Unspecified mood [affective] disorder: Secondary | ICD-10-CM

## 2017-06-10 LAB — COMPREHENSIVE METABOLIC PANEL
AG Ratio: 1.8 (calc) (ref 1.0–2.5)
ALT: 22 U/L (ref 6–29)
BUN: 18 mg/dL (ref 7–25)
Chloride: 107 mmol/L (ref 98–110)
Creat: 0.67 mg/dL (ref 0.50–1.05)
Glucose, Bld: 95 mg/dL (ref 65–99)
Total Protein: 6.9 g/dL (ref 6.1–8.1)

## 2017-06-10 LAB — EHRLICHIA ANTIBODY PANEL
E. CHAFFEENSIS AB IGG: <1:64 {titer}
E. CHAFFEENSIS AB IGM: <1:20 {titer}

## 2017-06-10 LAB — COMPREHENSIVE METABOLIC PANEL WITH GFR
AST: 25 U/L (ref 10–35)
Albumin: 4.4 g/dL (ref 3.6–5.1)
Alkaline phosphatase (APISO): 64 U/L (ref 33–130)
CO2: 27 mmol/L (ref 20–32)
Calcium: 9.6 mg/dL (ref 8.6–10.4)
Globulin: 2.5 g/dL (ref 1.9–3.7)
Potassium: 4.8 mmol/L (ref 3.5–5.3)
Sodium: 141 mmol/L (ref 135–146)
Total Bilirubin: 0.4 mg/dL (ref 0.2–1.2)

## 2017-06-10 LAB — TSH: TSH: 1.09 mIU/L (ref 0.40–4.50)

## 2017-06-10 LAB — LIPID PANEL W/REFLEX DIRECT LDL
Cholesterol: 231 mg/dL — ABNORMAL HIGH (ref <200)
HDL: 86 mg/dL (ref >50)
LDL Cholesterol (Calc): 132 mg/dL — ABNORMAL HIGH
Non-HDL Cholesterol (Calc): 145 mg/dL (calc) — ABNORMAL HIGH (ref <130)
Total CHOL/HDL Ratio: 2.7 (calc) (ref <5.0)
Triglycerides: 44 mg/dL (ref <150)

## 2017-06-10 LAB — CBC
HCT: 39.1 % (ref 35.0–45.0)
Hemoglobin: 13.2 g/dL (ref 11.7–15.5)
MCH: 32 pg (ref 27.0–33.0)
MCHC: 33.8 g/dL (ref 32.0–36.0)
MCV: 94.9 fL (ref 80.0–100.0)
MPV: 10.5 fL (ref 7.5–12.5)
Platelets: 290 10*3/uL (ref 140–400)
RBC: 4.12 Million/uL (ref 3.80–5.10)
RDW: 12.7 % (ref 11.0–15.0)
WBC: 4 10*3/uL (ref 3.8–10.8)

## 2017-06-10 LAB — ROCKY MTN SPOTTED FVR ABS PNL(IGG+IGM)
RMSF IgG: NOT DETECTED
RMSF IgM: NOT DETECTED

## 2017-06-10 LAB — HEMOGLOBIN A1C
Hgb A1c MFr Bld: 5.5 % of total Hgb (ref <5.7)
Mean Plasma Glucose: 111 (calc)
eAG (mmol/L): 6.2 (calc)

## 2017-06-10 LAB — B. BURGDORFI ANTIBODIES: B burgdorferi Ab IgG+IgM: <0.9 {index}

## 2017-07-03 ENCOUNTER — Ambulatory Visit: Payer: BLUE CROSS/BLUE SHIELD | Admitting: Sports Medicine

## 2017-08-16 DIAGNOSIS — H16143 Punctate keratitis, bilateral: Secondary | ICD-10-CM | POA: Diagnosis not present

## 2017-09-03 ENCOUNTER — Other Ambulatory Visit: Payer: Self-pay | Admitting: Sports Medicine

## 2017-10-09 ENCOUNTER — Encounter: Payer: Self-pay | Admitting: Sports Medicine

## 2017-10-09 ENCOUNTER — Ambulatory Visit (INDEPENDENT_AMBULATORY_CARE_PROVIDER_SITE_OTHER): Payer: BLUE CROSS/BLUE SHIELD | Admitting: Sports Medicine

## 2017-10-09 DIAGNOSIS — M1712 Unilateral primary osteoarthritis, left knee: Secondary | ICD-10-CM

## 2017-10-09 NOTE — Assessment & Plan Note (Signed)
Previous injection was in May, repeat left knee injection today, return as needed. We will discuss Visco supplementation at future visits.

## 2017-10-09 NOTE — Progress Notes (Signed)
Subjective:    CC: Left knee pain  HPI: Vanessa Snyder is a pleasant 57 year old female, for the past couple weeks she said increasing pain in her left knee, moderate, worsening, localized over the medial joint line without radiation.  Oral NSAIDs are not effective, would like to try interventional treatment.  No mechanical symptoms.  No trauma.  I reviewed the past medical history, family history, social history, surgical history, and allergies today and no changes were needed.  Please see the problem list section below in epic for further details.  Past Medical History: No past medical history on file. Past Surgical History: Past Surgical History:  Procedure Laterality Date  . AUGMENTATION MAMMAPLASTY    . CESAREAN SECTION    . PLACEMENT OF BREAST IMPLANTS     Social History: Social History   Socioeconomic History  . Marital status: Married    Spouse name: Not on file  . Number of children: Not on file  . Years of education: Not on file  . Highest education level: Not on file  Occupational History  . Not on file  Social Needs  . Financial resource strain: Not on file  . Food insecurity:    Worry: Not on file    Inability: Not on file  . Transportation needs:    Medical: Not on file    Non-medical: Not on file  Tobacco Use  . Smoking status: Never Smoker  . Smokeless tobacco: Never Used  Substance and Sexual Activity  . Alcohol use: Not on file  . Drug use: Not on file  . Sexual activity: Not on file  Lifestyle  . Physical activity:    Days per week: Not on file    Minutes per session: Not on file  . Stress: Not on file  Relationships  . Social connections:    Talks on phone: Not on file    Gets together: Not on file    Attends religious service: Not on file    Active member of club or organization: Not on file    Attends meetings of clubs or organizations: Not on file    Relationship status: Not on file  Other Topics Concern  . Not on file  Social History  Narrative  . Not on file   Family History: No family history on file. Allergies: No Known Allergies Medications: See med rec.  Review of Systems: No fevers, chills, night sweats, weight loss, chest pain, or shortness of breath.   Objective:    General: Well Developed, well nourished, and in no acute distress.  Neuro: Alert and oriented x3, extra-ocular muscles intact, sensation grossly intact.  HEENT: Normocephalic, atraumatic, pupils equal round reactive to light, neck supple, no masses, no lymphadenopathy, thyroid nonpalpable.  Skin: Warm and dry, no rashes. Cardiac: Regular rate and rhythm, no murmurs rubs or gallops, no lower extremity edema.  Respiratory: Clear to auscultation bilaterally. Not using accessory muscles, speaking in full sentences. Left knee: Normal to inspection with no erythema or effusion or obvious bony abnormalities. Tender to palpation at the medial joint line ROM normal in flexion and extension and lower leg rotation. Ligaments with solid consistent endpoints including ACL, PCL, LCL, MCL. Negative Mcmurray's and provocative meniscal tests. Non painful patellar compression. Patellar and quadriceps tendons unremarkable. Hamstring and quadriceps strength is normal.  Procedure: Real-time Ultrasound Guided Injection of left knee Device: GE Logiq E  Verbal informed consent obtained.  Time-out conducted.  Noted no overlying erythema, induration, or other signs of local infection.  Skin prepped in a sterile fashion.  Local anesthesia: Topical Ethyl chloride.  With sterile technique and under real time ultrasound guidance: 1 cc Kenalog 40, 2 cc lidocaine, 2 cc bupivacaine injected easily into the suprapatellar recess. Completed without difficulty  Pain immediately resolved suggesting accurate placement of the medication.  Advised to call if fevers/chills, erythema, induration, drainage, or persistent bleeding.  Images permanently stored and available for  review in the ultrasound unit.  Impression: Technically successful ultrasound guided injection.  Impression and Recommendations:    Primary osteoarthritis of left knee Previous injection was in May, repeat left knee injection today, return as needed. We will discuss Visco supplementation at future visits. ___________________________________________ Ihor Austin. Benjamin Stain, M.D., ABFM., CAQSM. Primary Care and Sports Medicine [facility]  Adjunct Instructor of Family Medicine  [employer]

## 2017-11-29 ENCOUNTER — Other Ambulatory Visit: Payer: Self-pay | Admitting: Sports Medicine

## 2017-11-29 DIAGNOSIS — F39 Unspecified mood [affective] disorder: Secondary | ICD-10-CM

## 2018-01-30 DIAGNOSIS — R300 Dysuria: Secondary | ICD-10-CM | POA: Diagnosis not present

## 2018-01-30 DIAGNOSIS — R309 Painful micturition, unspecified: Secondary | ICD-10-CM | POA: Diagnosis not present

## 2018-02-03 ENCOUNTER — Other Ambulatory Visit: Payer: Self-pay | Admitting: Sports Medicine

## 2018-03-10 ENCOUNTER — Other Ambulatory Visit: Payer: Self-pay | Admitting: Sports Medicine

## 2018-03-10 DIAGNOSIS — Z1231 Encounter for screening mammogram for malignant neoplasm of breast: Secondary | ICD-10-CM

## 2018-04-23 ENCOUNTER — Ambulatory Visit: Payer: BLUE CROSS/BLUE SHIELD

## 2018-05-21 ENCOUNTER — Telehealth: Payer: Self-pay | Admitting: Sports Medicine

## 2018-05-21 ENCOUNTER — Ambulatory Visit: Payer: BLUE CROSS/BLUE SHIELD | Admitting: Sports Medicine

## 2018-05-21 ENCOUNTER — Ambulatory Visit (INDEPENDENT_AMBULATORY_CARE_PROVIDER_SITE_OTHER): Payer: BLUE CROSS/BLUE SHIELD

## 2018-05-21 ENCOUNTER — Ambulatory Visit (INDEPENDENT_AMBULATORY_CARE_PROVIDER_SITE_OTHER): Payer: BLUE CROSS/BLUE SHIELD | Admitting: Sports Medicine

## 2018-05-21 ENCOUNTER — Other Ambulatory Visit: Payer: Self-pay

## 2018-05-21 DIAGNOSIS — M5136 Other intervertebral disc degeneration, lumbar region: Secondary | ICD-10-CM

## 2018-05-21 DIAGNOSIS — I781 Nevus, non-neoplastic: Secondary | ICD-10-CM | POA: Insufficient documentation

## 2018-05-21 DIAGNOSIS — M1712 Unilateral primary osteoarthritis, left knee: Secondary | ICD-10-CM | POA: Diagnosis not present

## 2018-05-21 DIAGNOSIS — M51369 Other intervertebral disc degeneration, lumbar region without mention of lumbar back pain or lower extremity pain: Secondary | ICD-10-CM

## 2018-05-21 MED ORDER — GABAPENTIN 300 MG PO CAPS: ORAL_CAPSULE | ORAL | 3 refills | Status: DC

## 2018-05-21 MED ORDER — LIDOCAINE 5 % EX OINT: 1.0000 "application " | TOPICAL_OINTMENT | Freq: Every day | CUTANEOUS | 11 refills | Status: DC

## 2018-05-21 NOTE — Progress Notes (Signed)
Subjective:    CC: Several issues  HPI: This is a pleasant 58 year old female, she has left knee osteoarthritis, pain is now moderate, persistent, localized at the medial joint line and anteriorly without radiation, last injection was October 09, 2017.  Back pain: With radiation down the right leg, worse with sitting, flexion, Valsalva, no bowel or bladder dysfunction, saddle numbness, constitutional symptoms.  Spider veins: Would like these addressed  I reviewed the past medical history, family history, social history, surgical history, and allergies today and no changes were needed.  Please see the problem list section below in epic for further details.  Past Medical History: No past medical history on file. Past Surgical History: Past Surgical History:  Procedure Laterality Date  . AUGMENTATION MAMMAPLASTY    . CESAREAN SECTION    . PLACEMENT OF BREAST IMPLANTS     Social History: Social History   Socioeconomic History  . Marital status: Married    Spouse name: Not on file  . Number of children: Not on file  . Years of education: Not on file  . Highest education level: Not on file  Occupational History  . Not on file  Social Needs  . Financial resource strain: Not on file  . Food insecurity:    Worry: Not on file    Inability: Not on file  . Transportation needs:    Medical: Not on file    Non-medical: Not on file  Tobacco Use  . Smoking status: Never Smoker  . Smokeless tobacco: Never Used  Substance and Sexual Activity  . Alcohol use: Not on file  . Drug use: Not on file  . Sexual activity: Not on file  Lifestyle  . Physical activity:    Days per week: Not on file    Minutes per session: Not on file  . Stress: Not on file  Relationships  . Social connections:    Talks on phone: Not on file    Gets together: Not on file    Attends religious service: Not on file    Active member of club or organization: Not on file    Attends meetings of clubs or  organizations: Not on file    Relationship status: Not on file  Other Topics Concern  . Not on file  Social History Narrative  . Not on file   Family History: No family history on file. Allergies: No Known Allergies Medications: See med rec.  Review of Systems: No fevers, chills, night sweats, weight loss, chest pain, or shortness of breath.   Objective:    General: Well Developed, well nourished, and in no acute distress.  Neuro: Alert and oriented x3, extra-ocular muscles intact, sensation grossly intact.  HEENT: Normocephalic, atraumatic, pupils equal round reactive to light, neck supple, no masses, no lymphadenopathy, thyroid nonpalpable.  Skin: Warm and dry, no rashes.  Reticular and spider veins on the medial and lateral aspects of the left knee. Cardiac: Regular rate and rhythm, no murmurs rubs or gallops, no lower extremity edema.  Respiratory: Clear to auscultation bilaterally. Not using accessory muscles, speaking in full sentences. Left knee: Normal to inspection with no erythema or effusion or obvious bony abnormalities. Tender at the medial joint line ROM normal in flexion and extension and lower leg rotation. Ligaments with solid consistent endpoints including ACL, PCL, LCL, MCL. Negative Mcmurray's and provocative meniscal tests. Non painful patellar compression. Patellar and quadriceps tendons unremarkable. Hamstring and quadriceps strength is normal.  Procedure: Real-time Ultrasound Guided injection of the  left knee Device: GE Logiq E  Verbal informed consent obtained.  Time-out conducted.  Noted no overlying erythema, induration, or other signs of local infection.  Skin prepped in a sterile fashion.  Local anesthesia: Topical Ethyl chloride.  With sterile technique and under real time ultrasound guidance:  1 cc Kenalog 40, 2 cc lidocaine, 2 cc bupivacaine injected easily Completed without difficulty  Pain immediately resolved suggesting accurate placement  of the medication.  Advised to call if fevers/chills, erythema, induration, drainage, or persistent bleeding.  Images permanently stored and available for review in the ultrasound unit.  Impression: Technically successful ultrasound guided injection.  Impression and Recommendations:    Primary osteoarthritis of left knee Repeat knee injection today, return as needed.  Lumbar degenerative disc disease Recurrence of right sided posterior thigh radicular pain with axial discogenic back pain. Adding formal PT, new set of x-rays, gabapentin his pain is worse at night. Return to see me in 4 to 6 weeks for this if needed.  Spider veins There are few reticular and spider veins, she will return on Friday for sclerotherapy with hypertonic saline. She is going to get some compression hose and I am using topical lidocaine for her to use 2 hours before the procedure.   ___________________________________________ Ihor Austin. Benjamin Stain, M.D., ABFM., CAQSM. Primary Care and Sports Medicine  [facility]  Adjunct Professor of Family Medicine  [employer]

## 2018-05-21 NOTE — Telephone Encounter (Signed)
Received fax from [employer] that Lidocaine ointment requires a PA. Information has been sent to the insurance company. Awaiting determination.

## 2018-05-21 NOTE — Assessment & Plan Note (Signed)
Recurrence of right sided posterior thigh radicular pain with axial discogenic back pain. Adding formal PT, new set of x-rays, gabapentin his pain is worse at night. Return to see me in 4 to 6 weeks for this if needed.

## 2018-05-21 NOTE — Telephone Encounter (Signed)
Approved today (lidocaine ointment) Effective from 05/21/2018 through 08/18/2018. Pharmacy aware.

## 2018-05-21 NOTE — Assessment & Plan Note (Signed)
There are few reticular and spider veins, she will return on Friday for sclerotherapy with hypertonic saline. She is going to get some compression hose and I am using topical lidocaine for her to use 2 hours before the procedure.

## 2018-05-21 NOTE — Assessment & Plan Note (Signed)
Repeat knee injection today, return as needed.

## 2018-05-23 ENCOUNTER — Ambulatory Visit (INDEPENDENT_AMBULATORY_CARE_PROVIDER_SITE_OTHER): Payer: BLUE CROSS/BLUE SHIELD | Admitting: Sports Medicine

## 2018-05-23 DIAGNOSIS — I781 Nevus, non-neoplastic: Secondary | ICD-10-CM

## 2018-05-23 NOTE — Progress Notes (Signed)
   Procedure: Sclerotherapy of multiple incompetent left lateral thigh reticular and spider veins Consent obtained and verified. Time-out conducted. Noted no overlying erythema, induration, or other signs of local infection. Skin prepped in a sterile fashion. Patient applied topical lidocaine before the procedure Completed without difficulty. Meds: Using a total of 1.5 cc 23.4% hypertonic saline, 1.5 cc lidocaine 1% without epinephrine I advanced the needle into multiple reticular and spider veins, and injected medication to sclerose the endothelium, we then applied a compressive wrap to the veins. Advised to call if fevers/chills, erythema, induration, drainage, or persistent bleeding.

## 2018-05-23 NOTE — Assessment & Plan Note (Signed)
Sclerotherapy with hypertonic saline, left lateral distal femur/thigh. They were quite extensive so we will need to do this again most likely. The site was compressed with an Ace bandage, she does have her thigh-high compression hose coming in soon. Return to see me in a week to do a different site.

## 2018-05-30 ENCOUNTER — Ambulatory Visit (INDEPENDENT_AMBULATORY_CARE_PROVIDER_SITE_OTHER): Payer: BLUE CROSS/BLUE SHIELD | Admitting: Sports Medicine

## 2018-05-30 DIAGNOSIS — I781 Nevus, non-neoplastic: Secondary | ICD-10-CM

## 2018-05-30 NOTE — Assessment & Plan Note (Signed)
Sclerotherapy of multiple incompetent reticular and spider veins on the left medial knee, return in 1 week for additional treatments if needed. Patient understands it can take several weeks for full effect to be seen.

## 2018-05-30 NOTE — Progress Notes (Signed)
   Procedure: Sclerotherapy of multiple incompetent reticular and spider veins on the left medial knee Consent obtained and verified. Time-out conducted. Noted no overlying erythema, induration, or other signs of local infection. Skin prepped in a sterile fashion. Patient applied topical lidocaine before the procedure Completed without difficulty. Meds: Using a total of 1.5 cc of 23.4% hypertonic saline, 1.5 cc lidocaine 1% without epinephrine I advanced the 30g needle into multiple reticular and spider veins, and injected medication to sclerose the endothelium, we then applied a compressive wrap to the veins. Advised to call if fevers/chills, erythema, induration, drainage, or persistent bleeding.

## 2018-06-04 ENCOUNTER — Ambulatory Visit (INDEPENDENT_AMBULATORY_CARE_PROVIDER_SITE_OTHER): Payer: BLUE CROSS/BLUE SHIELD | Admitting: Sports Medicine

## 2018-06-04 ENCOUNTER — Encounter: Payer: Self-pay | Admitting: Sports Medicine

## 2018-06-04 DIAGNOSIS — I781 Nevus, non-neoplastic: Secondary | ICD-10-CM

## 2018-06-04 NOTE — Assessment & Plan Note (Signed)
Sclerotherapy of multiple incompetent reticular and spider veins on the right medial knee. Return in 1 week for additional treatments, we can do the right posterior thigh and upper inner thigh. Continue thigh-high compression hose. The left side is starting to look significantly better.

## 2018-06-04 NOTE — Progress Notes (Signed)
   Procedure: Sclerotherapy of multiple incompetent reticular and spider veins on the right inner knee Consent obtained and verified. Time-out conducted. Noted no overlying erythema, induration, or other signs of local infection. Skin prepped in a sterile fashion. Patient applied topical lidocaine before the procedure Completed without difficulty. Meds: Using a total of 1.5 cc of 23.4% hypertonic saline, 1.5 cc lidocaine 1% without epinephrine I advanced the 30g needle into multiple reticular and spider veins, and injected medication to sclerose the endothelium, we then applied a compressive wrap to the veins. Advised to call if fevers/chills, erythema, induration, drainage, or persistent bleeding.

## 2018-06-11 ENCOUNTER — Ambulatory Visit (INDEPENDENT_AMBULATORY_CARE_PROVIDER_SITE_OTHER): Payer: BLUE CROSS/BLUE SHIELD | Admitting: Sports Medicine

## 2018-06-11 DIAGNOSIS — I781 Nevus, non-neoplastic: Secondary | ICD-10-CM

## 2018-06-11 NOTE — Assessment & Plan Note (Signed)
Repeat sclerotherapy of multiple incompetent reticular and spider veins on the left posterior lateral knee. Continue compression hose. I can see her back as needed, all prior sites are looking significantly better.  We use CPT code 94327

## 2018-06-11 NOTE — Progress Notes (Signed)
   Procedure: Sclerotherapy of multiple incompetent reticular and spider veins on the left lateral and posterior knee Consent obtained and verified. Time-out conducted. Noted no overlying erythema, induration, or other signs of local infection. Skin prepped in a sterile fashion. Patient applied topical lidocaine before the procedure Completed without difficulty. Meds: Using a total of 2 cc of 23.4% hypertonic saline, 2 cc lidocaine 1% without epinephrine I advanced the 30g needle into multiple reticular and spider veins, and injected medication to sclerose the endothelium, we then applied a compressive wrap to the veins. Advised to call if fevers/chills, erythema, induration, drainage, or persistent bleeding.

## 2018-06-14 ENCOUNTER — Other Ambulatory Visit: Payer: Self-pay | Admitting: Sports Medicine

## 2018-06-14 DIAGNOSIS — F39 Unspecified mood [affective] disorder: Secondary | ICD-10-CM

## 2018-06-18 ENCOUNTER — Ambulatory Visit: Payer: BLUE CROSS/BLUE SHIELD | Admitting: Sports Medicine

## 2018-07-02 ENCOUNTER — Other Ambulatory Visit: Payer: Self-pay

## 2018-07-02 ENCOUNTER — Ambulatory Visit (INDEPENDENT_AMBULATORY_CARE_PROVIDER_SITE_OTHER): Payer: BC Managed Care – PPO

## 2018-07-02 DIAGNOSIS — Z6827 Body mass index (BMI) 27.0-27.9, adult: Secondary | ICD-10-CM | POA: Diagnosis not present

## 2018-07-02 DIAGNOSIS — Z01411 Encounter for gynecological examination (general) (routine) with abnormal findings: Secondary | ICD-10-CM | POA: Diagnosis not present

## 2018-07-02 DIAGNOSIS — R309 Painful micturition, unspecified: Secondary | ICD-10-CM | POA: Diagnosis not present

## 2018-07-02 DIAGNOSIS — Z1231 Encounter for screening mammogram for malignant neoplasm of breast: Secondary | ICD-10-CM

## 2018-07-02 DIAGNOSIS — R300 Dysuria: Secondary | ICD-10-CM | POA: Diagnosis not present

## 2018-08-12 ENCOUNTER — Ambulatory Visit: Payer: BLUE CROSS/BLUE SHIELD | Admitting: Sports Medicine

## 2018-08-20 ENCOUNTER — Ambulatory Visit: Payer: BC Managed Care – PPO | Admitting: Sports Medicine

## 2018-08-21 ENCOUNTER — Ambulatory Visit (INDEPENDENT_AMBULATORY_CARE_PROVIDER_SITE_OTHER): Payer: BC Managed Care – PPO | Admitting: Sports Medicine

## 2018-08-21 ENCOUNTER — Other Ambulatory Visit: Payer: Self-pay

## 2018-08-21 DIAGNOSIS — M1712 Unilateral primary osteoarthritis, left knee: Secondary | ICD-10-CM

## 2018-08-21 NOTE — Progress Notes (Signed)
Subjective:    CC: Knee pain  HPI: Gwynneth returns, she is a pleasant 58 year old female, she has knee osteoarthritis, the last injection was 3 months ago, now having a recurrence of pain, moderate, persistent, localized at the medial joint line without radiation.  I reviewed the past medical history, family history, social history, surgical history, and allergies today and no changes were needed.  Please see the problem list section below in epic for further details.  Past Medical History: No past medical history on file. Past Surgical History: Past Surgical History:  Procedure Laterality Date  . AUGMENTATION MAMMAPLASTY    . CESAREAN SECTION    . PLACEMENT OF BREAST IMPLANTS     Social History: Social History   Socioeconomic History  . Marital status: Married    Spouse name: Not on file  . Number of children: Not on file  . Years of education: Not on file  . Highest education level: Not on file  Occupational History  . Not on file  Social Needs  . Financial resource strain: Not on file  . Food insecurity    Worry: Not on file    Inability: Not on file  . Transportation needs    Medical: Not on file    Non-medical: Not on file  Tobacco Use  . Smoking status: Never Smoker  . Smokeless tobacco: Never Used  Substance and Sexual Activity  . Alcohol use: Not on file  . Drug use: Not on file  . Sexual activity: Not on file  Lifestyle  . Physical activity    Days per week: Not on file    Minutes per session: Not on file  . Stress: Not on file  Relationships  . Social Herbalist on phone: Not on file    Gets together: Not on file    Attends religious service: Not on file    Active member of club or organization: Not on file    Attends meetings of clubs or organizations: Not on file    Relationship status: Not on file  Other Topics Concern  . Not on file  Social History Narrative  . Not on file   Family History: No family history on file. Allergies:  No Known Allergies Medications: See med rec.  Review of Systems: No fevers, chills, night sweats, weight loss, chest pain, or shortness of breath.   Objective:    General: Well Developed, well nourished, and in no acute distress.  Neuro: Alert and oriented x3, extra-ocular muscles intact, sensation grossly intact.  HEENT: Normocephalic, atraumatic, pupils equal round reactive to light, neck supple, no masses, no lymphadenopathy, thyroid nonpalpable.  Skin: Warm and dry, no rashes. Cardiac: Regular rate and rhythm, no murmurs rubs or gallops, no lower extremity edema.  Respiratory: Clear to auscultation bilaterally. Not using accessory muscles, speaking in full sentences. Left knee: Normal to inspection with no erythema or effusion or obvious bony abnormalities. Tender to palpation of the medial joint line ROM normal in flexion and extension and lower leg rotation. Ligaments with solid consistent endpoints including ACL, PCL, LCL, MCL. Negative Mcmurray's and provocative meniscal tests. Non painful patellar compression. Patellar and quadriceps tendons unremarkable. Hamstring and quadriceps strength is normal.  Procedure: Real-time Ultrasound Guided injection of the left knee Device: GE Logiq E  Verbal informed consent obtained.  Time-out conducted.  Noted no overlying erythema, induration, or other signs of local infection.  Skin prepped in a sterile fashion.  Local anesthesia: Topical Ethyl chloride.  With sterile technique and under real time ultrasound guidance:  1 cc Kenalog, 2 cc lidocaine, 2 cc bupivacaine injected easily Completed without difficulty  Pain immediately resolved suggesting accurate placement of the medication.  Advised to call if fevers/chills, erythema, induration, drainage, or persistent bleeding.  Images permanently stored and available for review in the ultrasound unit.  Impression: Technically successful ultrasound guided injection.  Impression and  Recommendations:    Primary osteoarthritis of left knee Repeat knee injection today, previous injection was 3 months ago.   ___________________________________________ Ihor Austinhomas J. Benjamin Stainhekkekandam, M.D., ABFM., CAQSM. Primary Care and Sports Medicine [facility]  Adjunct Professor of Family Medicine  [employer]

## 2018-08-21 NOTE — Assessment & Plan Note (Signed)
Repeat knee injection today, previous injection was 3 months ago.

## 2018-10-27 ENCOUNTER — Encounter: Payer: Self-pay | Admitting: Sports Medicine

## 2018-10-27 ENCOUNTER — Ambulatory Visit (INDEPENDENT_AMBULATORY_CARE_PROVIDER_SITE_OTHER): Payer: BC Managed Care – PPO | Admitting: Sports Medicine

## 2018-10-27 ENCOUNTER — Other Ambulatory Visit: Payer: Self-pay

## 2018-10-27 DIAGNOSIS — M722 Plantar fascial fibromatosis: Secondary | ICD-10-CM | POA: Diagnosis not present

## 2018-10-27 DIAGNOSIS — I781 Nevus, non-neoplastic: Secondary | ICD-10-CM

## 2018-10-27 NOTE — Assessment & Plan Note (Signed)
Sclerotherapy of spider veins and reticular veins on the left lateral knee. Return as needed to do another patch.

## 2018-10-27 NOTE — Assessment & Plan Note (Signed)
Referral to Dr. Raeford Razor for custom orthotics, rehab exercises given, avoid barefoot walking. Return in 1 month, injection versus air heel brace if no better.

## 2018-10-27 NOTE — Progress Notes (Signed)
Subjective:    CC: Heel pain, spider veins  HPI: Vanessa Snyder returns, she is a pleasant 58 year old female, she has several reticular and spider veins around her knees, we have done a few sessions of sclerotherapy with hypertonic saline with good results.  She has a few areas that she would like treated again.  She also has pain in her heel, left-sided, present for several weeks, worse with the first few steps in the morning.  Plantar aspect.  I reviewed the past medical history, family history, social history, surgical history, and allergies today and no changes were needed.  Please see the problem list section below in epic for further details.  Past Medical History: No past medical history on file. Past Surgical History: Past Surgical History:  Procedure Laterality Date  . AUGMENTATION MAMMAPLASTY    . CESAREAN SECTION    . PLACEMENT OF BREAST IMPLANTS     Social History: Social History   Socioeconomic History  . Marital status: Married    Spouse name: Not on file  . Number of children: Not on file  . Years of education: Not on file  . Highest education level: Not on file  Occupational History  . Not on file  Social Needs  . Financial resource strain: Not on file  . Food insecurity    Worry: Not on file    Inability: Not on file  . Transportation needs    Medical: Not on file    Non-medical: Not on file  Tobacco Use  . Smoking status: Never Smoker  . Smokeless tobacco: Never Used  Substance and Sexual Activity  . Alcohol use: Not on file  . Drug use: Not on file  . Sexual activity: Not on file  Lifestyle  . Physical activity    Days per week: Not on file    Minutes per session: Not on file  . Stress: Not on file  Relationships  . Social Musician on phone: Not on file    Gets together: Not on file    Attends religious service: Not on file    Active member of club or organization: Not on file    Attends meetings of clubs or organizations: Not on file     Relationship status: Not on file  Other Topics Concern  . Not on file  Social History Narrative  . Not on file   Family History: No family history on file. Allergies: No Known Allergies Medications: See med rec.  Review of Systems: No fevers, chills, night sweats, weight loss, chest pain, or shortness of breath.   Objective:    General: Well Developed, well nourished, and in no acute distress.  Neuro: Alert and oriented x3, extra-ocular muscles intact, sensation grossly intact.  HEENT: Normocephalic, atraumatic, pupils equal round reactive to light, neck supple, no masses, no lymphadenopathy, thyroid nonpalpable.  Skin: Warm and dry, no rashes. Cardiac: Regular rate and rhythm, no murmurs rubs or gallops, no lower extremity edema.  Respiratory: Clear to auscultation bilaterally. Not using accessory muscles, speaking in full sentences. Left foot: No visible erythema or swelling. Range of motion is full in all directions. Strength is 5/5 in all directions. No hallux valgus. No pes cavus or pes planus. No abnormal callus noted. No pain over the navicular prominence, or base of fifth metatarsal. Moderate tenderness to palpation of the calcaneal insertion of plantar fascia. No pain at the Achilles insertion. No pain over the calcaneal bursa. No pain of the retrocalcaneal bursa. No  tenderness to palpation over the tarsals, metatarsals, or phalanges. No hallux rigidus or limitus. No tenderness palpation over interphalangeal joints. No pain with compression of the metatarsal heads. Neurovascularly intact distally.  Procedure: Sclerotherapy of multiple incompetent reticular and spider veins on the left lateral knee Consent obtained and verified. Time-out conducted. Noted no overlying erythema, induration, or other signs of local infection. Skin prepped in a sterile fashion. Patient applied topical lidocaine before the procedure Completed without difficulty. Meds: Using a  total of 3 cc of 23.4% hypertonic saline, 3 cc lidocaine 1% without epinephrine I advanced the 30g needle into multiple reticular and spider veins, and injected medication to sclerose the endothelium, we then applied a compressive wrap to the veins. Advised to call if fevers/chills, erythema, induration, drainage, or persistent bleeding.  Impression and Recommendations:    Spider veins Sclerotherapy of spider veins and reticular veins on the left lateral knee. Return as needed to do another patch.  Plantar fasciitis, left Referral to Dr. Raeford Razor for custom orthotics, rehab exercises given, avoid barefoot walking. Return in 1 month, injection versus air heel brace if no better.   ___________________________________________ Gwen Her. Dianah Field, M.D., ABFM., CAQSM. Primary Care and Sports Medicine [facility]  Adjunct Professor of [employer]

## 2018-10-29 ENCOUNTER — Encounter: Payer: Self-pay | Admitting: Family Medicine

## 2018-10-29 ENCOUNTER — Ambulatory Visit: Payer: BC Managed Care – PPO | Admitting: Family Medicine

## 2018-10-29 ENCOUNTER — Other Ambulatory Visit: Payer: Self-pay

## 2018-10-29 DIAGNOSIS — M722 Plantar fascial fibromatosis: Secondary | ICD-10-CM

## 2018-10-29 NOTE — Progress Notes (Signed)
Vanessa Snyder - 58 y.o. female MRN [id number]  Date of birth: May 22, 1960  SUBJECTIVE:  Including CC & ROS.  Chief Complaint  Patient presents with  . Foot Orthotics    Vanessa Snyder is a 58 y.o. female that is  Presenting with left heel pain. Diagnosed with plantar fasciitis. Symptoms are localized to the base of the heel. Has a significant bunion on the right side. Symptoms are intermittent in nature. The pain has been ongoing for weeks. Denies a specific inciting event or injury. It is worse with the first few steps in the morning.   Review of Systems  Constitutional: Negative for fever.  HENT: Negative for congestion.   Respiratory: Negative for cough.   Cardiovascular: Negative for chest pain.  Gastrointestinal: Negative for abdominal pain.  Musculoskeletal: Positive for arthralgias.  Skin: Negative for color change.  Neurological: Negative for weakness.  Hematological: Negative for adenopathy.    HISTORY: Past Medical, Surgical, Social, and Family History Reviewed & Updated per EMR.   Pertinent Historical Findings include:  No past medical history on file.  Past Surgical History:  Procedure Laterality Date  . AUGMENTATION MAMMAPLASTY    . CESAREAN SECTION    . PLACEMENT OF BREAST IMPLANTS      No Known Allergies  No family history on file.   Social History   Socioeconomic History  . Marital status: Married    Spouse name: Not on file  . Number of children: Not on file  . Years of education: Not on file  . Highest education level: Not on file  Occupational History  . Not on file  Social Needs  . Financial resource strain: Not on file  . Food insecurity    Worry: Not on file    Inability: Not on file  . Transportation needs    Medical: Not on file    Non-medical: Not on file  Tobacco Use  . Smoking status: Never Smoker  . Smokeless tobacco: Never Used  Substance and Sexual Activity  . Alcohol use: Not on file  . Drug use: Not on file  . Sexual  activity: Not on file  Lifestyle  . Physical activity    Days per week: Not on file    Minutes per session: Not on file  . Stress: Not on file  Relationships  . Social Herbalist on phone: Not on file    Gets together: Not on file    Attends religious service: Not on file    Active member of club or organization: Not on file    Attends meetings of clubs or organizations: Not on file    Relationship status: Not on file  . Intimate partner violence    Fear of current or ex partner: Not on file    Emotionally abused: Not on file    Physically abused: Not on file    Forced sexual activity: Not on file  Other Topics Concern  . Not on file  Social History Narrative  . Not on file     PHYSICAL EXAM:  VS: BP 109/73   Ht 5\' 8"  (1.727 m)   Wt 180 lb (81.6 kg)   BMI 27.37 kg/m  Physical Exam Gen: NAD, alert, cooperative with exam, well-appearing ENT: normal lips, normal nasal mucosa,  Eye: normal EOM, normal conjunctiva and lids CV:  no edema, +2 pedal pulses   Resp: no accessory muscle use, non-labored,  GI: no masses or tenderness, no hernia  Skin: no rashes,  no areas of induration  Neuro: normal tone, normal sensation to touch Psych:  normal insight, alert and oriented MSK:  Left foot:  TTp on the plantar aspect of the heel  Mild degenerative changes of the 1st MTP joint  Normal ROM  Normal strength to resistance  Maintained longitudinal arch  NVI   Patient was fitted for a standard, cushioned, semi-rigid orthotic. The orthotic was heated and afterward the patient stood on the orthotic blank positioned on the orthotic stand. The patient was positioned in subtalar neutral position and 10 degrees of ankle dorsiflexion in a weight bearing stance. After completion of molding, a stable base was applied to the orthotic blank. The blank was ground to a stable position for weight bearing. Size: 10  Base: Blue EVA Additional Posting and Padding: None The patient  ambulated these, and they were very comfortable.    ASSESSMENT & PLAN:   Plantar fasciitis, left Symptoms seem consistent with PF.  - orthotics  - counseled on supportive care and gait  - counseled on management of orthotics.  - could consider adding first ray post to right with severe bunion.

## 2018-10-29 NOTE — Assessment & Plan Note (Signed)
Symptoms seem consistent with PF.  - orthotics  - counseled on supportive care and gait  - counseled on management of orthotics.  - could consider adding first ray post to right with severe bunion.

## 2018-11-11 ENCOUNTER — Ambulatory Visit (INDEPENDENT_AMBULATORY_CARE_PROVIDER_SITE_OTHER): Payer: BC Managed Care – PPO

## 2018-11-11 ENCOUNTER — Ambulatory Visit (INDEPENDENT_AMBULATORY_CARE_PROVIDER_SITE_OTHER): Payer: BC Managed Care – PPO | Admitting: Sports Medicine

## 2018-11-11 ENCOUNTER — Encounter: Payer: Self-pay | Admitting: Sports Medicine

## 2018-11-11 ENCOUNTER — Other Ambulatory Visit: Payer: Self-pay

## 2018-11-11 DIAGNOSIS — G8929 Other chronic pain: Secondary | ICD-10-CM

## 2018-11-11 DIAGNOSIS — M25572 Pain in left ankle and joints of left foot: Secondary | ICD-10-CM

## 2018-11-11 DIAGNOSIS — M19072 Primary osteoarthritis, left ankle and foot: Secondary | ICD-10-CM | POA: Diagnosis not present

## 2018-11-11 DIAGNOSIS — M722 Plantar fascial fibromatosis: Secondary | ICD-10-CM

## 2018-11-11 DIAGNOSIS — M79672 Pain in left foot: Secondary | ICD-10-CM

## 2018-11-11 DIAGNOSIS — M7989 Other specified soft tissue disorders: Secondary | ICD-10-CM | POA: Diagnosis not present

## 2018-11-11 NOTE — Assessment & Plan Note (Signed)
Localized to the lateral talar dome, greater than 6 weeks in spite of physician directed conservative measures, x-rays and MRI today.

## 2018-11-11 NOTE — Assessment & Plan Note (Signed)
Injection as above, air heel brace. X-rays of the foot and ankle, MRI of the foot and ankle, symptoms present now for greater than 6 weeks in spite of physician directed conservative measures. Second opinion from Dr. Melony Overly per patient request.

## 2018-11-11 NOTE — Progress Notes (Signed)
Subjective:    CC: Left foot pain  HPI: Sindee is a pleasant 58 year old female, we have been treating her for plantar fasciitis and ankle pain, at this point she had rehab exercises, custom orthotics, NSAIDs, nothing is working.  Symptoms are present for greater than 6 weeks in spite of physician directed conservative measures.  I reviewed the past medical history, family history, social history, surgical history, and allergies today and no changes were needed.  Please see the problem list section below in epic for further details.  Past Medical History: No past medical history on file. Past Surgical History: Past Surgical History:  Procedure Laterality Date  . AUGMENTATION MAMMAPLASTY    . CESAREAN SECTION    . PLACEMENT OF BREAST IMPLANTS     Social History: Social History   Socioeconomic History  . Marital status: Married    Spouse name: Not on file  . Number of children: Not on file  . Years of education: Not on file  . Highest education level: Not on file  Occupational History  . Not on file  Social Needs  . Financial resource strain: Not on file  . Food insecurity    Worry: Not on file    Inability: Not on file  . Transportation needs    Medical: Not on file    Non-medical: Not on file  Tobacco Use  . Smoking status: Never Smoker  . Smokeless tobacco: Never Used  Substance and Sexual Activity  . Alcohol use: Not on file  . Drug use: Not on file  . Sexual activity: Not on file  Lifestyle  . Physical activity    Days per week: Not on file    Minutes per session: Not on file  . Stress: Not on file  Relationships  . Social Musician on phone: Not on file    Gets together: Not on file    Attends religious service: Not on file    Active member of club or organization: Not on file    Attends meetings of clubs or organizations: Not on file    Relationship status: Not on file  Other Topics Concern  . Not on file  Social History Narrative  . Not on  file   Family History: No family history on file. Allergies: No Known Allergies Medications: See med rec.  Review of Systems: No fevers, chills, night sweats, weight loss, chest pain, or shortness of breath.   Objective:    General: Well Developed, well nourished, and in no acute distress.  Neuro: Alert and oriented x3, extra-ocular muscles intact, sensation grossly intact.  HEENT: Normocephalic, atraumatic, pupils equal round reactive to light, neck supple, no masses, no lymphadenopathy, thyroid nonpalpable.  Skin: Warm and dry, no rashes. Cardiac: Regular rate and rhythm, no murmurs rubs or gallops, no lower extremity edema.  Respiratory: Clear to auscultation bilaterally. Not using accessory muscles, speaking in full sentences. Left foot: No visible erythema or swelling. Range of motion is full in all directions. Strength is 5/5 in all directions. No hallux valgus. No pes cavus or pes planus. No abnormal callus noted. No pain over the navicular prominence, or base of fifth metatarsal. Moderate tenderness to palpation of the calcaneal insertion of plantar fascia. No pain at the Achilles insertion. No pain over the calcaneal bursa. No pain of the retrocalcaneal bursa. No tenderness to palpation over the tarsals, metatarsals, or phalanges. No hallux rigidus or limitus. No tenderness palpation over interphalangeal joints. No pain with compression  of the metatarsal heads. Neurovascularly intact distally. Left ankle: No visible erythema or swelling. Range of motion is full in all directions. Strength is 5/5 in all directions. Stable lateral and medial ligaments; squeeze test and kleiger test unremarkable; Severe tenderness over the lateral talar dome No pain at base of 5th MT; No tenderness over cuboid; No tenderness over N spot or navicular prominence No tenderness on posterior aspects of lateral and medial malleolus No sign of peroneal tendon subluxations; Negative tarsal  tunnel tinel's Able to walk 4 steps.  Procedure: Real-time Ultrasound Guided injection of the left plantar fascia Device: GE Logiq E  Verbal informed consent obtained.  Time-out conducted.  Noted no overlying erythema, induration, or other signs of local infection.  Skin prepped in a sterile fashion.  Local anesthesia: Topical Ethyl chloride.  With sterile technique and under real time ultrasound guidance:  1 cc Kenalog 40, 1 cc lidocaine, 1 cc bupivacaine injected easily Completed without difficulty  Pain immediately resolved suggesting accurate placement of the medication.  Advised to call if fevers/chills, erythema, induration, drainage, or persistent bleeding.  Images permanently stored and available for review in the ultrasound unit.  Impression: Technically successful ultrasound guided injection.  Impression and Recommendations:    Plantar fasciitis, left Injection as above, air heel brace. X-rays of the foot and ankle, MRI of the foot and ankle, symptoms present now for greater than 6 weeks in spite of physician directed conservative measures. Second opinion from Dr. Melony Overly per patient request.   Left ankle pain Localized to the lateral talar dome, greater than 6 weeks in spite of physician directed conservative measures, x-rays and MRI today.   ___________________________________________ Gwen Her. Dianah Field, M.D., ABFM., CAQSM. Primary Care and Sports Medicine [facility]  Adjunct Professor of [employer]

## 2018-12-16 ENCOUNTER — Other Ambulatory Visit: Payer: Self-pay | Admitting: Sports Medicine

## 2018-12-16 DIAGNOSIS — F39 Unspecified mood [affective] disorder: Secondary | ICD-10-CM

## 2018-12-24 ENCOUNTER — Ambulatory Visit: Payer: BC Managed Care – PPO | Admitting: Sports Medicine

## 2019-03-09 ENCOUNTER — Other Ambulatory Visit: Payer: Self-pay | Admitting: Sports Medicine

## 2019-03-09 MED ORDER — MELOXICAM 15 MG PO TABS: 15.0000 mg | ORAL_TABLET | Freq: Every day | ORAL | 1 refills | Status: DC

## 2019-03-17 DIAGNOSIS — Z1211 Encounter for screening for malignant neoplasm of colon: Secondary | ICD-10-CM | POA: Diagnosis not present

## 2019-03-17 DIAGNOSIS — K5730 Diverticulosis of large intestine without perforation or abscess without bleeding: Secondary | ICD-10-CM | POA: Diagnosis not present

## 2019-03-17 LAB — HM COLONOSCOPY

## 2019-04-02 ENCOUNTER — Encounter: Payer: Self-pay | Admitting: Sports Medicine

## 2019-04-14 ENCOUNTER — Ambulatory Visit (INDEPENDENT_AMBULATORY_CARE_PROVIDER_SITE_OTHER): Payer: BC Managed Care – PPO | Admitting: Sports Medicine

## 2019-04-14 ENCOUNTER — Other Ambulatory Visit: Payer: Self-pay

## 2019-04-14 DIAGNOSIS — M1712 Unilateral primary osteoarthritis, left knee: Secondary | ICD-10-CM

## 2019-04-14 NOTE — Assessment & Plan Note (Signed)
Vanessa Snyder returns, she is a pleasant 59 year old female with knee osteoarthritis, last injected her knee back in July 2020, recurrence of pain, repeat injection today. Return as needed.

## 2019-04-14 NOTE — Progress Notes (Signed)
    Procedures performed today:    Procedure: Real-time Ultrasound Guided injection of the left knee Device: Samsung HS60  Verbal informed consent obtained.  Time-out conducted.  Noted no overlying erythema, induration, or other signs of local infection.  Skin prepped in a sterile fashion.  Local anesthesia: Topical Ethyl chloride.  With sterile technique and under real time ultrasound guidance: 1 cc Kenalog 40, 2 cc lidocaine, 2 cc bupivacaine injected easily Completed without difficulty  Pain immediately resolved suggesting accurate placement of the medication.  Advised to call if fevers/chills, erythema, induration, drainage, or persistent bleeding.  Images permanently stored and available for review in the ultrasound unit.  Impression: Technically successful ultrasound guided injection.  Independent interpretation of notes and tests performed by another provider:   None.  Impression and Recommendations:    Primary osteoarthritis of left knee Vanessa Snyder returns, she is a pleasant 59 year old female with knee osteoarthritis, last injected her knee back in July 2020, recurrence of pain, repeat injection today. Return as needed.    ___________________________________________ Ihor Austin. Benjamin Stain, M.D., ABFM., CAQSM. Primary Care and Sports Medicine [facility]  Adjunct Instructor of Family Medicine  [employer]

## 2019-05-21 ENCOUNTER — Other Ambulatory Visit: Payer: Self-pay | Admitting: Sports Medicine

## 2019-05-21 DIAGNOSIS — Z1231 Encounter for screening mammogram for malignant neoplasm of breast: Secondary | ICD-10-CM

## 2019-06-08 ENCOUNTER — Other Ambulatory Visit: Payer: Self-pay | Admitting: Sports Medicine

## 2019-06-08 DIAGNOSIS — F39 Unspecified mood [affective] disorder: Secondary | ICD-10-CM

## 2019-07-03 ENCOUNTER — Ambulatory Visit: Payer: BC Managed Care – PPO | Admitting: Sports Medicine

## 2019-07-03 MED ORDER — GENERIC EXTERNAL MEDICATION
Status: DC
Start: ? — End: 2019-07-03

## 2019-07-03 MED ORDER — HYDROCODONE-ACETAMINOPHEN 5-325 MG PO TABS
1.00 | ORAL_TABLET | ORAL | Status: DC
Start: ? — End: 2019-07-03

## 2019-07-03 MED ORDER — SENNOSIDES-DOCUSATE SODIUM 8.6-50 MG PO TABS
1.00 | ORAL_TABLET | ORAL | Status: DC
Start: ? — End: 2019-07-03

## 2019-07-03 MED ORDER — MORPHINE SULFATE (PF) 2 MG/ML IV SOLN
2.00 | INTRAVENOUS | Status: DC
Start: ? — End: 2019-07-03

## 2019-07-03 MED ORDER — MORPHINE SULFATE 4 MG/ML IJ SOLN
4.00 | INTRAMUSCULAR | Status: DC
Start: ? — End: 2019-07-03

## 2019-07-03 MED ORDER — HYDROCODONE-ACETAMINOPHEN 10-325 MG PO TABS
1.00 | ORAL_TABLET | ORAL | Status: DC
Start: ? — End: 2019-07-03

## 2019-07-03 MED ORDER — ALBUTEROL SULFATE (2.5 MG/3ML) 0.083% IN NEBU
2.50 | INHALATION_SOLUTION | RESPIRATORY_TRACT | Status: DC
Start: ? — End: 2019-07-03

## 2019-07-03 MED ORDER — NITROGLYCERIN 0.4 MG SL SUBL
0.40 | SUBLINGUAL_TABLET | SUBLINGUAL | Status: DC
Start: ? — End: 2019-07-03

## 2019-07-03 MED ORDER — SODIUM CHLORIDE 0.9 % IV SOLN
10.00 | INTRAVENOUS | Status: DC
Start: ? — End: 2019-07-03

## 2019-07-08 ENCOUNTER — Ambulatory Visit (INDEPENDENT_AMBULATORY_CARE_PROVIDER_SITE_OTHER): Payer: No Typology Code available for payment source

## 2019-07-08 ENCOUNTER — Ambulatory Visit (INDEPENDENT_AMBULATORY_CARE_PROVIDER_SITE_OTHER): Payer: No Typology Code available for payment source | Admitting: Sports Medicine

## 2019-07-08 ENCOUNTER — Ambulatory Visit: Payer: BC Managed Care – PPO

## 2019-07-08 ENCOUNTER — Other Ambulatory Visit: Payer: Self-pay

## 2019-07-08 ENCOUNTER — Encounter: Payer: Self-pay | Admitting: Sports Medicine

## 2019-07-08 DIAGNOSIS — R1031 Right lower quadrant pain: Secondary | ICD-10-CM

## 2019-07-08 DIAGNOSIS — M1712 Unilateral primary osteoarthritis, left knee: Secondary | ICD-10-CM

## 2019-07-08 DIAGNOSIS — I2699 Other pulmonary embolism without acute cor pulmonale: Secondary | ICD-10-CM | POA: Insufficient documentation

## 2019-07-08 DIAGNOSIS — I2609 Other pulmonary embolism with acute cor pulmonale: Secondary | ICD-10-CM

## 2019-07-08 NOTE — Assessment & Plan Note (Signed)
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

## 2019-07-08 NOTE — Assessment & Plan Note (Signed)
We are to put her knees on the back burner for now but she is going to come back on Friday for knee injections.

## 2019-07-08 NOTE — Progress Notes (Signed)
    Procedures performed today:    None.  Independent interpretation of notes and tests performed by another provider:   I reviewed hospital records.  See below for further details.  Brief History, Exam, Impression, and Recommendations:    Pulmonary embolism (HCC) This is a pleasant 59 year old female, she recently had bilateral pulmonary embolisms, noted right heart strain consistent with cor pulmonale. She was treated in the hospital with catheter directed thrombolysis, and was discharged on Eliquis feeling much better. Prior to her PE she was having some right groin pain. Her family has mentioned that a mass was noted in her groin, I am unable to palpate anything today but I would certainly like a soft tissue ultrasound of her right groin, as well as a repeat DVT ultrasound of her right lower extremity today stat. Continue Eliquis for now. Her new onset symptoms are potentially life-threatening problem.  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  Primary osteoarthritis of left knee We are to put her knees on the back burner for now but she is going to come back on Friday for knee injections.    ___________________________________________ Ihor Austin. Benjamin Stain, M.D., ABFM., CAQSM. Primary Care and Sports Medicine [facility] [facility]  Adjunct Instructor of Family Medicine  [employer]

## 2019-07-08 NOTE — Assessment & Plan Note (Addendum)
This is a pleasant 59 year old female, she recently had bilateral pulmonary embolisms, noted right heart strain consistent with cor pulmonale. She was treated in the hospital with catheter directed thrombolysis, and was discharged on Eliquis feeling much better. Prior to her PE she was having some right groin pain. Her family has mentioned that a mass was noted in her groin, I am unable to palpate anything today but I would certainly like a soft tissue ultrasound of her right groin, as well as a repeat DVT ultrasound of her right lower extremity today stat. Continue Eliquis for now. Her new onset symptoms are potentially life-threatening problem.

## 2019-07-09 ENCOUNTER — Ambulatory Visit (INDEPENDENT_AMBULATORY_CARE_PROVIDER_SITE_OTHER): Payer: No Typology Code available for payment source

## 2019-07-09 DIAGNOSIS — Z1231 Encounter for screening mammogram for malignant neoplasm of breast: Secondary | ICD-10-CM

## 2019-07-10 ENCOUNTER — Other Ambulatory Visit: Payer: Self-pay

## 2019-07-10 ENCOUNTER — Ambulatory Visit (INDEPENDENT_AMBULATORY_CARE_PROVIDER_SITE_OTHER): Payer: No Typology Code available for payment source | Admitting: Sports Medicine

## 2019-07-10 DIAGNOSIS — M722 Plantar fascial fibromatosis: Secondary | ICD-10-CM | POA: Diagnosis not present

## 2019-07-10 DIAGNOSIS — M170 Bilateral primary osteoarthritis of knee: Secondary | ICD-10-CM

## 2019-07-10 DIAGNOSIS — I2609 Other pulmonary embolism with acute cor pulmonale: Secondary | ICD-10-CM

## 2019-07-10 MED ORDER — APIXABAN 5 MG PO TABS: 5.0000 mg | ORAL_TABLET | Freq: Two times a day (BID) | ORAL | 1 refills | Status: DC

## 2019-07-10 NOTE — Assessment & Plan Note (Signed)
Bilateral knee osteoarthritis, last injection was approximately 3 months ago, left-sided, repeat injections today due to worsening pain, bilateral. Return as needed for this.

## 2019-07-10 NOTE — Progress Notes (Signed)
    Procedures performed today:    Procedure: Real-time Ultrasound Guided injection of the left knee Device: Samsung HS60  Verbal informed consent obtained.  Time-out conducted.  Noted no overlying erythema, induration, or other signs of local infection.  Skin prepped in a sterile fashion.  Local anesthesia: Topical Ethyl chloride.  With sterile technique and under real time ultrasound guidance: 1 cc Kenalog 40, 2 cc lidocaine, 2 cc bupivacaine injected easily Completed without difficulty  Pain immediately resolved suggesting accurate placement of the medication.  Advised to call if fevers/chills, erythema, induration, drainage, or persistent bleeding.  Images permanently stored and available for review in the ultrasound unit.  Impression: Technically successful ultrasound guided injection.  Procedure: Real-time Ultrasound Guided injection of the right knee Device: Samsung HS60  Verbal informed consent obtained.  Time-out conducted.  Noted no overlying erythema, induration, or other signs of local infection.  Skin prepped in a sterile fashion.  Local anesthesia: Topical Ethyl chloride.  With sterile technique and under real time ultrasound guidance: 1 cc Kenalog 40, 2 cc lidocaine, 2 cc bupivacaine injected easily Completed without difficulty  Pain immediately resolved suggesting accurate placement of the medication.  Advised to call if fevers/chills, erythema, induration, drainage, or persistent bleeding.  Images permanently stored and available for review in the ultrasound unit.  Impression: Technically successful ultrasound guided injection.  Procedure: Real-time Ultrasound Guided injection of the left plantar fascial origin Device: Samsung HS60  Verbal informed consent obtained.  Time-out conducted.  Noted no overlying erythema, induration, or other signs of local infection.  Skin prepped in a sterile fashion.  Local anesthesia: Topical Ethyl chloride.  With sterile  technique and under real time ultrasound guidance: 1 cc Kenalog 40, 1 cc lidocaine, 1 cc bupivacaine injected easily Completed without difficulty  Pain immediately resolved suggesting accurate placement of the medication.  Advised to call if fevers/chills, erythema, induration, drainage, or persistent bleeding.  Images permanently stored and available for review in the ultrasound unit.  Impression: Technically successful ultrasound guided injection.  Independent interpretation of notes and tests performed by another provider:   None.  Brief History, Exam, Impression, and Recommendations:    Plantar fasciitis, left This is a very pleasant 59 year old female, she has known plantar fasciitis, last injected in October 2020, now with recurrence of pain, repeat injection today.  Primary osteoarthritis of both knees Bilateral knee osteoarthritis, last injection was approximately 3 months ago, left-sided, repeat injections today due to worsening pain, bilateral. Return as needed for this.  Pulmonary embolism [facility]) Aitana also had a large bilateral pulmonary embolism, right heart strain noted consistent with cor pulmonale. She had catheter directed thrombolysis in the hospital and was discharged on Eliquis. 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 I am going to refill her Eliquis for full dose treatment. 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 No DVTs were noted on recent right lower extremity DVT ultrasound. I will likely order an echocardiogram in 3 months to ensure resolution of the right heart strain.    ___________________________________________ Ihor Austin. Benjamin Stain, M.D., ABFM., CAQSM. Primary Care and Sports Medicine [facility]  Adjunct  Instructor of Family Medicine  [employer]

## 2019-07-10 NOTE — Assessment & Plan Note (Signed)
This is a very pleasant 59 year old female, she has known plantar fasciitis, last injected in October 2020, now with recurrence of pain, repeat injection today.

## 2019-07-10 NOTE — Assessment & Plan Note (Signed)
Kyliegh also had a large bilateral pulmonary embolism, right heart strain noted consistent with cor pulmonale. She had catheter directed thrombolysis in the hospital and was discharged on Eliquis. 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 I am going to refill her Eliquis for full dose treatment. 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 No DVTs were noted on recent right lower extremity DVT ultrasound. I will likely order an echocardiogram in 3 months to ensure resolution of the right heart strain.

## 2019-07-20 ENCOUNTER — Telehealth: Payer: Self-pay

## 2019-07-20 NOTE — Telephone Encounter (Signed)
There is no specific location, my advice to her is to call around and see if any of the local massage places will accept a prescription and file insurance, some people will also have an HSA account that can be used to pay for the massage if a prescription is obtained.

## 2019-07-20 NOTE — Telephone Encounter (Signed)
Vanessa Snyder called and left a message about how and where to go for massage therapy. Also she states she did not receive the prescription. Please advise.

## 2019-07-20 NOTE — Telephone Encounter (Signed)
Patient advised.

## 2019-08-26 ENCOUNTER — Ambulatory Visit: Payer: No Typology Code available for payment source | Admitting: Sports Medicine

## 2019-09-03 ENCOUNTER — Other Ambulatory Visit: Payer: Self-pay | Admitting: Sports Medicine

## 2019-10-01 ENCOUNTER — Ambulatory Visit (INDEPENDENT_AMBULATORY_CARE_PROVIDER_SITE_OTHER): Payer: No Typology Code available for payment source | Admitting: Nurse Practitioner

## 2019-10-01 ENCOUNTER — Ambulatory Visit: Payer: No Typology Code available for payment source

## 2019-10-01 ENCOUNTER — Encounter: Payer: Self-pay | Admitting: Nurse Practitioner

## 2019-10-01 ENCOUNTER — Other Ambulatory Visit: Payer: Self-pay

## 2019-10-01 VITALS — BP 105/76 | HR 55 | Temp 98.0°F | Ht 68.0 in | Wt 172.8 lb

## 2019-10-01 DIAGNOSIS — M79604 Pain in right leg: Secondary | ICD-10-CM

## 2019-10-01 DIAGNOSIS — I2609 Other pulmonary embolism with acute cor pulmonale: Secondary | ICD-10-CM

## 2019-10-01 NOTE — Patient Instructions (Signed)
Echocardiogram An echocardiogram is a procedure that uses painless sound waves (ultrasound) to produce an image of the heart. Images from an echocardiogram can provide important information about:  Signs of coronary artery disease (CAD).  Aneurysm detection. An aneurysm is a weak or damaged part of an artery wall that bulges out from the normal force of blood pumping through the body.  Heart size and shape. Changes in the size or shape of the heart can be associated with certain conditions, including heart failure, aneurysm, and CAD.  Heart muscle function.  Heart valve function.  Signs of a past heart attack.  Fluid buildup around the heart.  Thickening of the heart muscle.  A tumor or infectious growth around the heart valves. Tell a health care provider about:  Any allergies you have.  All medicines you are taking, including vitamins, herbs, eye drops, creams, and over-the-counter medicines.  Any blood disorders you have.  Any surgeries you have had.  Any medical conditions you have.  Whether you are pregnant or may be pregnant. What are the risks? Generally, this is a safe procedure. However, problems may occur, including:  Allergic reaction to dye (contrast) that may be used during the procedure. What happens before the procedure? No specific preparation is needed. You may eat and drink normally. What happens during the procedure?   An IV tube may be inserted into one of your veins.  You may receive contrast through this tube. A contrast is an injection that improves the quality of the pictures from your heart.  A gel will be applied to your chest.  A wand-like tool (transducer) will be moved over your chest. The gel will help to transmit the sound waves from the transducer.  The sound waves will harmlessly bounce off of your heart to allow the heart images to be captured in real-time motion. The images will be recorded on a computer. The procedure may vary  among health care providers and hospitals. What happens after the procedure?  You may return to your normal, everyday life, including diet, activities, and medicines, unless your health care provider tells you not to do that. Summary  An echocardiogram is a procedure that uses painless sound waves (ultrasound) to produce an image of the heart.  Images from an echocardiogram can provide important information about the size and shape of your heart, heart muscle function, heart valve function, and fluid buildup around your heart.  You do not need to do anything to prepare before this procedure. You may eat and drink normally.  After the echocardiogram is completed, you may return to your normal, everyday life, unless your health care provider tells you not to do that. This information is not intended to replace advice given to you by your health care provider. Make sure you discuss any questions you have with your health care provider. Document Revised: 05/08/2018 Document Reviewed: 02/18/2016 Elsevier Patient Education  2020 [employer]    Deep Vein Thrombosis  Deep vein thrombosis (DVT) is a condition in which a blood clot forms in a deep vein, such as a lower leg, thigh, or arm vein. A clot is blood that has thickened into a gel or solid. This condition is dangerous. It can lead to serious and even life-threatening complications if the clot travels to the lungs and causes a blockage (pulmonary embolism). It can also damage veins in the leg. This can result in leg pain, swelling, discoloration, and sores (post-thrombotic syndrome). What are the causes? This condition may  be caused by:  A slowdown of blood flow.  Damage to a vein.  A condition that causes blood to clot more easily, such as an inherited clotting disorder. What increases the risk? The following factors may make you more likely to develop this condition:  Being overweight.  Being older, especially over age  72.  Sitting or lying down for more than four hours.  Being in the hospital.  Lack of physical activity (sedentary lifestyle).  Pregnancy, being in childbirth, or having recently given birth.  Taking medicines that contain estrogen, such as medicines to prevent pregnancy.  Smoking.  A history of any of the following: ? Blood clots or a blood clotting disease. ? Peripheral vascular disease. ? Inflammatory bowel disease. ? Cancer. ? Heart disease. ? Genetic conditions that affect how your blood clots, such as Factor V Leiden mutation. ? Neurological diseases that affect your legs (leg paresis). ? A recent injury, such as a car accident. ? Major or lengthy surgery. ? A central line placed inside a large vein. What are the signs or symptoms? Symptoms of this condition include:  Swelling, pain, or tenderness in an arm or leg.  Warmth, redness, or discoloration in an arm or leg. If the clot is in your leg, symptoms may be more noticeable or worse when you stand or walk. Some people may not develop any symptoms. How is this diagnosed? This condition is diagnosed with:  A medical history and physical exam.  Tests, such as: ? Blood tests. These are done to check how well your blood clots. ? Ultrasound. This is done to check for clots. ? Venogram. For this test, contrast dye is injected into a vein and X-rays are taken to check for any clots. How is this treated? Treatment for this condition depends on:  The cause of your DVT.  Your risk for bleeding or developing more clots.  Any other medical conditions that you have. Treatment may include:  Taking a blood thinner (anticoagulant). This type of medicine prevents clots from forming. It may be taken by mouth, injected under the skin, or injected through an IV (catheter).  Injecting clot-dissolving medicines into the affected vein (catheter-directed thrombolysis).  Having surgery. Surgery may be done to: ? Remove the  clot. ? Place a filter in a large vein to catch blood clots before they reach the lungs. Some treatments may be continued for up to six months. Follow these instructions at home: If you are taking blood thinners:  Take the medicine exactly as told by your health care provider. Some blood thinners need to be taken at the same time every day. Do not skip a dose.  Talk with your health care provider before you take any medicines that contain aspirin or NSAIDs. These medicines increase your risk for dangerous bleeding.  Ask your health care provider about foods and drugs that could change the way the medicine works (may interact). Avoid those things if your health care provider tells you to do so.  Blood thinners can cause easy bruising and may make it difficult to stop bleeding. Because of this: ? Be very careful when using knives, scissors, or other sharp objects. ? Use an electric razor instead of a blade. ? Avoid activities that could cause injury or bruising, and follow instructions about how to prevent falls.  Wear a medical alert bracelet or carry a card that lists what medicines you take. General instructions  Take over-the-counter and prescription medicines only as told by your health  care provider.  Return to your normal activities as told by your health care provider. Ask your health care provider what activities are safe for you.  Wear compression stockings if recommended by your health care provider.  Keep all follow-up visits as told by your health care provider. This is important. How is this prevented? To lower your risk of developing this condition again:  For 30 or more minutes every day, do an activity that: ? Involves moving your arms and legs. ? Increases your heart rate.  When traveling for longer than four hours: ? Exercise your arms and legs every hour. ? Drink plenty of water. ? Avoid drinking alcohol.  Avoid sitting or lying for a long time without moving  your legs.  If you have surgery or you are hospitalized, ask about ways to prevent blood clots. These may include taking frequent walks or using anticoagulants.  Stay at a healthy weight.  If you are a woman who is older than age 54, avoid unnecessary use of medicines that contain estrogen, such as some birth control pills.  Do not use any products that contain nicotine or tobacco, such as cigarettes and e-cigarettes. This is especially important if you take estrogen medicines. If you need help quitting, ask your health care provider. Contact a health care provider if:  You miss a dose of your blood thinner.  Your menstrual period is heavier than usual.  You have unusual bruising. Get help right away if:  You have: ? New or increased pain, swelling, or redness in an arm or leg. ? Numbness or tingling in an arm or leg. ? Shortness of breath. ? Chest pain. ? A rapid or irregular heartbeat. ? A severe headache or confusion. ? A cut that will not stop bleeding.  There is blood in your vomit, stool, or urine.  You have a serious fall or accident, or you hit your head.  You feel light-headed or dizzy.  You cough up blood. These symptoms may represent a serious problem that is an emergency. Do not wait to see if the symptoms will go away. Get medical help right away. Call your local emergency services (911 in the [place] Do not drive yourself to the hospital. Summary  Deep vein thrombosis (DVT) is a condition in which a blood clot forms in a deep vein, such as a lower leg, thigh, or arm vein.  Symptoms can include swelling, warmth, pain, and redness in your leg or arm.  This condition may be treated with a blood thinner (anticoagulant medicine), medicine that is injected to dissolve blood clots,compression stockings, or surgery.  If you are prescribed blood thinners, take them exactly as told. This information is not intended to replace advice given to you by your health care  provider. Make sure you discuss any questions you have with your health care provider. Document Revised: 12/28/2016 Document Reviewed: 06/15/2016 Elsevier Patient Education  2020 [employer].

## 2019-10-01 NOTE — Progress Notes (Signed)
No evidence of DVT on ultrasound. 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

## 2019-10-01 NOTE — Progress Notes (Signed)
Acute Office Visit  Subjective:    Patient ID: Vanessa Snyder, female    DOB: Jan 02, 1961, 59 y.o.   MRN: [id number]  Chief Complaint  Patient presents with  . Leg Pain    right x 2 weeks, behind knee, denies injury, pain over weekend 10/10, taking Tylenol with minimal relief    HPI Patient is in today for pinpoint pain located on the superior and lateral side of the right popliteal fossa.  She reports this pain has been present for about 2 weeks.  She has been taking Tylenol to help with the pain however this has not been effective.  She reports pain with walking and bending the leg.  She also reports pain waking her in the middle of the night.  This weekend her pain she rated at 10 out of 10.  She describes the pain as a deep, throbbing sensation.    She denies any known injury to the area, redness in the area of pain, streaking, or nodularity at the area of pain.  She denies chest pain or shortness of breath at rest.  She endorses intermittent palpitations with increased anxiety, mild shortness of breath with moderate activity, occasional dizziness not associated to anything that she can think of, numbness/tingling in the lower right extremity, swelling of the lower right extremity, and popliteal pain on the right lower extremity.  She has been using heat, elevation, and rest in addition to Tylenol to help with the pain and symptoms.  She does have a history significant for bilateral pulmonary embolism and previous DVT.  She is currently on Eliquis 5 mg/day.  She reports she has been taking this medication as prescribed and has not missed any doses.  She also reports a family history positive for factor VII clotting disorder.  She has not been tested for clotting disorders to date.  She does have an appointment upcoming in the next 2 weeks with hematology and they have indicated they would like to have a CBC with differential, CMP, D-dimer, and hypercoagulation panel drawn at that time.  She  also has an appointment with her PCP, my colleague Dr. Benjamin Stain, next week.  He had plan to order an echocardiogram at this visit to evaluate for right-sided heart enlargement that was noted earlier this year at the time of the PE.  History reviewed. No pertinent past medical history.  Past Surgical History:  Procedure Laterality Date  . AUGMENTATION MAMMAPLASTY    . CESAREAN SECTION    . PLACEMENT OF BREAST IMPLANTS      History reviewed. No pertinent family history.  Social History   Socioeconomic History  . Marital status: Married    Spouse name: Not on file  . Number of children: Not on file  . Years of education: Not on file  . Highest education level: Not on file  Occupational History  . Not on file  Tobacco Use  . Smoking status: Never Smoker  . Smokeless tobacco: Never Used  Substance and Sexual Activity  . Alcohol use: Not on file  . Drug use: Not on file  . Sexual activity: Not on file  Other Topics Concern  . Not on file  Social History Narrative  . Not on file   Social Determinants of Health   Financial Resource Strain:   . Difficulty of Paying Living Expenses: Not on file  Food Insecurity:   . Worried About Programme researcher, broadcasting/film/video in the Last Year: Not on file  . Ran Out of  Food in the Last Year: Not on file  Transportation Needs:   . Lack of Transportation (Medical): Not on file  . Lack of Transportation (Non-Medical): Not on file  Physical Activity:   . Days of Exercise per Week: Not on file  . Minutes of Exercise per Session: Not on file  Stress:   . Feeling of Stress : Not on file  Social Connections:   . Frequency of Communication with Friends and Family: Not on file  . Frequency of Social Gatherings with Friends and Family: Not on file  . Attends Religious Services: Not on file  . Active Member of Clubs or Organizations: Not on file  . Attends BankerClub or Organization Meetings: Not on file  . Marital Status: Not on file  Intimate Partner  Violence:   . Fear of Current or Ex-Partner: Not on file  . Emotionally Abused: Not on file  . Physically Abused: Not on file  . Sexually Abused: Not on file    Outpatient Medications Prior to Visit  Medication Sig Dispense Refill  . apixaban (ELIQUIS) 5 MG TABS tablet Take 1 tablet (5 mg total) by mouth 2 (two) times daily. 180 tablet 1  . Fish Oil OIL by Does not apply route.    . Glucosamine 750 MG TABS Take by mouth.    . Misc Natural Products (COSAMIN ASU ADVANCED FORMULA) CAPS Take 2 tablets by mouth 2 (two) times daily. 120 capsule 11  . sertraline (ZOLOFT) 25 MG tablet TAKE 1 TABLET BY MOUTH EVERY DAY 90 tablet 1  . thiamine (VITAMIN B-1) 100 MG tablet Take 100 mg by mouth daily.    . Diclofenac Sodium 2 % SOLN Place 2 sprays onto the skin 2 (two) times daily. (Patient not taking: Reported on 10/01/2019) 1 Bottle 11  . lidocaine (XYLOCAINE) 5 % ointment Apply 1 application topically daily. 2 hours before skin procedure. (Patient not taking: Reported on 10/01/2019) 50 g 11  . meloxicam (MOBIC) 15 MG tablet TAKE 1 TABLET BY MOUTH EVERY DAY (Patient not taking: Reported on 10/01/2019) 90 tablet 1   No facility-administered medications prior to visit.    No Known Allergies     Objective:    Physical Exam Vitals and nursing note reviewed.  Constitutional:      Appearance: Normal appearance.  HENT:     Head: Normocephalic.  Eyes:     Extraocular Movements: Extraocular movements intact.     Conjunctiva/sclera: Conjunctivae normal.     Pupils: Pupils are equal, round, and reactive to light.  Neck:     Vascular: No carotid bruit.  Cardiovascular:     Rate and Rhythm: Normal rate and regular rhythm.     Pulses: Normal pulses.     Heart sounds: Normal heart sounds. No murmur heard.  No friction rub. No gallop.   Pulmonary:     Effort: Pulmonary effort is normal.     Breath sounds: Wheezing present.  Abdominal:     General: Abdomen is flat. Bowel sounds are normal.      Palpations: Abdomen is soft.  Musculoskeletal:        General: Tenderness present.     Cervical back: Normal range of motion.     Right lower leg: Edema present.       Legs:     Comments: +1 pitting edema noticed in the right calf and ankle.  Skin:    General: Skin is warm and dry.     Capillary Refill: Capillary refill takes  less than 2 seconds.     Findings: No bruising, erythema or rash.  Neurological:     General: No focal deficit present.     Mental Status: She is alert and oriented to person, place, and time.     Motor: No weakness.     Gait: Gait normal.  Psychiatric:        Mood and Affect: Mood normal.        Behavior: Behavior normal.        Thought Content: Thought content normal.        Judgment: Judgment normal.     BP 105/76   Pulse (!) 55   Temp 98 F (36.7 C) (Oral)   Ht 5\' 8"  (1.727 m)   Wt 172 lb 12.8 oz (78.4 kg)   SpO2 99%   BMI 26.27 kg/m  Wt Readings from Last 3 Encounters:  10/01/19 172 lb 12.8 oz (78.4 kg)  11/11/18 184 lb (83.5 kg)  10/29/18 180 lb (81.6 kg)    There are no preventive care reminders to display for this patient.  There are no preventive care reminders to display for this patient.   Lab Results  Component Value Date   TSH 1.09 06/05/2017   Lab Results  Component Value Date   WBC 4.0 06/05/2017   HGB 13.2 06/05/2017   HCT 39.1 06/05/2017   MCV 94.9 06/05/2017   PLT 290 06/05/2017   Lab Results  Component Value Date   NA 141 06/05/2017   K 4.8 06/05/2017   CO2 27 06/05/2017   GLUCOSE 95 06/05/2017   BUN 18 06/05/2017   CREATININE 0.67 06/05/2017   BILITOT 0.4 06/05/2017   ALKPHOS 61 04/26/2016   AST 25 06/05/2017   ALT 22 06/05/2017   PROT 6.9 06/05/2017   ALBUMIN 4.1 04/26/2016   CALCIUM 9.6 06/05/2017   Lab Results  Component Value Date   CHOL 231 (H) 06/05/2017   Lab Results  Component Value Date   HDL 86 06/05/2017   Lab Results  Component Value Date   LDLCALC 132 (H) 06/05/2017   Lab  Results  Component Value Date   TRIG 44 06/05/2017   Lab Results  Component Value Date   CHOLHDL 2.7 06/05/2017   Lab Results  Component Value Date   HGBA1C 5.5 06/05/2017       Assessment & Plan:   1. Leg pain, posterior, right Leg pain noted in the right popliteal fossa on the superior lateral side without known injury.  Given the patient's significant history for unprovoked DVT and subsequent bilateral pulmonary embolism I strongly suspect presence of possible DVT.  She does also have a history of arthritis in the knees but this is much likely the cause of the specific pain.  May also consider possible Baker's cyst if ultrasound is negative. Urgent vascular ultrasound of the right lower extremity ordered.   We will also plan to get labs of CBC with differential, CMP, and D-dimer that were planned in the future with hematology.  We will plan to send notes and lab results to hematology.  Echocardiogram planned to be ordered by PCP ordered today.   Will not obtain hypercoagulation panel today recommended by hematology due to difference in lab systems and lack of familiarity with what might be included in the panel hematology has requested. Discussed with the patient that it would be beneficial for her to have intrinsic and extrinsic clotting factors evaluated to see if she does carry a mutation or  deficiency in any of these areas. Recommend continuing elevation and heat to the area of concern while awaiting results of lab studies and ultrasound. We will make changes to plan of care based on results received. Please keep follow-up with hematology and PCP in the coming weeks as scheduled.  - CBC with Differential/Platelet - COMPLETE METABOLIC PANEL WITH GFR - D-Dimer, Quantitative - US Venous Img Lower Unilateral Right  2. 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  At this time she is not experiencing signs that would indicate worsening of this however there is concern for possible DVT to the right lower extremity which could increase her risk of future subsequent PEs.  She was found to have right-sided heart strain consistent with cor pulmonale at the time of her PE.  Will order follow-up echocardiogram today for further evaluation to ensure that this is resolving. Discussed emergency symptoms with the patient that would warrant immediate evaluation in the emergency room including sudden shortness of breath, dizziness, weakness, chest pain, confusion. Keep follow-up with PCP next week as scheduled.  - CBC with Differential/Platelet - COMPLETE METABOLIC PANEL WITH GFR - D-Dimer, Quantitative - ECHOCARDIOGRAM COMPLETE; Future - US Venous Img Lower Unilateral Right  Follow-up with PCP as scheduled next week. Further follow-up upon evaluation of results of testing.   Tollie Eth, NP

## 2019-10-01 NOTE — Assessment & Plan Note (Signed)
Large bilateral unprovoked pulmonary embolism identified earlier this year for which the patient was placed on Eliquis.  At this time she is not experiencing signs that would indicate worsening of this however there is concern for possible DVT to the right lower extremity which could increase her risk of future subsequent PEs.  She was found to have right-sided heart strain consistent with cor pulmonale at the time of her PE.  Will order follow-up echocardiogram today for further evaluation to ensure that this is resolving. Discussed emergency symptoms with the patient that would warrant immediate evaluation in the emergency room including sudden shortness of breath, dizziness, weakness, chest pain, confusion. Keep follow-up with PCP next week as scheduled.

## 2019-10-01 NOTE — Assessment & Plan Note (Addendum)
Leg pain noted in the right popliteal fossa on the superior lateral side without known injury.  Given the patient's significant history for unprovoked DVT and subsequent bilateral pulmonary embolism I strongly suspect presence of possible DVT.  She does also have a history of arthritis in the knees but this is much likely the cause of the specific pain.  May also consider possible Baker's cyst if ultrasound is negative. Urgent vascular ultrasound of the right lower extremity ordered.   We will also plan to get labs of CBC with differential, CMP, and D-dimer that were planned in the future with hematology.  We will plan to send notes and lab results to hematology.  Echocardiogram planned to be ordered by PCP ordered today.   Will not obtain hypercoagulation panel today recommended by hematology due to difference in lab systems and lack of familiarity with what might be included in the panel hematology has requested. Discussed with the patient that it would be beneficial for her to have intrinsic and extrinsic clotting factors evaluated to see if she does carry a mutation or deficiency in any of these areas. Recommend continuing elevation and heat to the area of concern while awaiting results of lab studies and ultrasound. We will make changes to plan of care based on results received. Please keep follow-up with hematology and PCP in the coming weeks as scheduled.

## 2019-10-02 LAB — COMPLETE METABOLIC PANEL WITH GFR
AG Ratio: 2 (calc) (ref 1.0–2.5)
ALT: 30 U/L — ABNORMAL HIGH (ref 6–29)
AST: 30 U/L (ref 10–35)
Albumin: 4.7 g/dL (ref 3.6–5.1)
Alkaline phosphatase (APISO): 62 U/L (ref 37–153)
BUN: 12 mg/dL (ref 7–25)
CO2: 29 mmol/L (ref 20–32)
Calcium: 10.2 mg/dL (ref 8.6–10.4)
Chloride: 104 mmol/L (ref 98–110)
Creat: 0.61 mg/dL (ref 0.50–0.99)
GFR, Est African American: 113 mL/min/{1.73_m2} (ref >=60)
GFR, Est Non African American: 98 mL/min/{1.73_m2} (ref >=60)
Globulin: 2.4 g/dL (calc) (ref 1.9–3.7)
Glucose, Bld: 97 mg/dL (ref 65–139)
Potassium: 5 mmol/L (ref 3.5–5.3)
Sodium: 140 mmol/L (ref 135–146)
Total Bilirubin: 0.6 mg/dL (ref 0.2–1.2)
Total Protein: 7.1 g/dL (ref 6.1–8.1)

## 2019-10-02 LAB — CBC WITH DIFFERENTIAL/PLATELET
Absolute Monocytes: 595 cells/uL (ref 200–950)
Basophils Absolute: 50 cells/uL (ref 0–200)
Basophils Relative: 1 %
Eosinophils Absolute: 150 cells/uL (ref 15–500)
Eosinophils Relative: 3 %
HCT: 40.8 % (ref 35.0–45.0)
Hemoglobin: 13.9 g/dL (ref 11.7–15.5)
Lymphs Abs: 1685 cells/uL (ref 850–3900)
MCH: 33.3 pg — ABNORMAL HIGH (ref 27.0–33.0)
MCHC: 34.1 g/dL (ref 32.0–36.0)
MCV: 97.8 fL (ref 80.0–100.0)
MPV: 10.2 fL (ref 7.5–12.5)
Monocytes Relative: 11.9 %
Neutro Abs: 2520 cells/uL (ref 1500–7800)
Neutrophils Relative %: 50.4 %
Platelets: 332 10*3/uL (ref 140–400)
RBC: 4.17 10*6/uL (ref 3.80–5.10)
RDW: 12.7 % (ref 11.0–15.0)
Total Lymphocyte: 33.7 %
WBC: 5 10*3/uL (ref 3.8–10.8)

## 2019-10-02 LAB — D-DIMER, QUANTITATIVE: D-Dimer, Quant: 0.26 mcg/mL FEU (ref <0.50)

## 2019-10-02 NOTE — Progress Notes (Signed)
Labs look good. D-dimer is normal, which is great. One of the liver markers was very slightly elevated, this is likely due to recent increased use of Tylenol. Avoid high doses of Tylenol for now.  Continue anticoagulant as prescribed and follow-up with Hematology and Dr. Karie Schwalbe as scheduled.

## 2019-10-08 ENCOUNTER — Encounter: Payer: Self-pay | Admitting: Sports Medicine

## 2019-10-08 ENCOUNTER — Telehealth: Payer: Self-pay | Admitting: Sports Medicine

## 2019-10-08 ENCOUNTER — Ambulatory Visit (INDEPENDENT_AMBULATORY_CARE_PROVIDER_SITE_OTHER): Payer: No Typology Code available for payment source | Admitting: Sports Medicine

## 2019-10-08 DIAGNOSIS — M722 Plantar fascial fibromatosis: Secondary | ICD-10-CM

## 2019-10-08 DIAGNOSIS — M170 Bilateral primary osteoarthritis of knee: Secondary | ICD-10-CM | POA: Diagnosis not present

## 2019-10-08 DIAGNOSIS — Z0000 Encounter for general adult medical examination without abnormal findings: Secondary | ICD-10-CM

## 2019-10-08 DIAGNOSIS — I2609 Other pulmonary embolism with acute cor pulmonale: Secondary | ICD-10-CM

## 2019-10-08 DIAGNOSIS — M5136 Other intervertebral disc degeneration, lumbar region: Secondary | ICD-10-CM

## 2019-10-08 DIAGNOSIS — M51369 Other intervertebral disc degeneration, lumbar region without mention of lumbar back pain or lower extremity pain: Secondary | ICD-10-CM

## 2019-10-08 MED ORDER — GABAPENTIN 300 MG PO CAPS: ORAL_CAPSULE | ORAL | 3 refills | Status: DC

## 2019-10-08 NOTE — Assessment & Plan Note (Signed)
At this point we do need to get her approved for Orthovisc, she has failed bilateral steroid injections and still has persistent pain. We will also see if the rep can get her some samples.

## 2019-10-08 NOTE — Assessment & Plan Note (Signed)
Vanessa Snyder has axial back pain with radiation down the back of the right leg, worse at night, worse when sitting. I do think this is radicular, adding gabapentin, if she has excessive sedation with gabapentin in an up taper we will switch to Gralise. Her ultrasound did show potential Baker's cyst however she was nontender and these were nonpalpable so I do not think these are the etiology. She has already had greater than 6 weeks of physician directed conservative measures so we will proceed with MRI for epidural planning as well.

## 2019-10-08 NOTE — Assessment & Plan Note (Signed)
Plan right now is for lifelong Eliquis. Repeating echocardiogram to ensure cor pulmonale has resolved. She does have follow-up with hematology.

## 2019-10-08 NOTE — Telephone Encounter (Signed)
Orthovisc approval please, x-ray confirmed, failed multiple modalities including injections, bilateral

## 2019-10-08 NOTE — Telephone Encounter (Signed)
Started Orthovisc case waiting on BID and to see if PA is required. - CF

## 2019-10-08 NOTE — Progress Notes (Signed)
    Procedures performed today:    None.  Independent interpretation of notes and tests performed by another provider:   None.  Brief History, Exam, Impression, and Recommendations:    Annual physical exam Due for cervical cancer screening, she will do it here with one of our female providers.  Lumbar degenerative disc disease Vanessa Snyder has axial back pain with radiation down the back of the right leg, worse at night, worse when sitting. I do think this is radicular, adding gabapentin, if she has excessive sedation with gabapentin in an up taper we will switch to Gralise. Her ultrasound did show potential Baker's cyst however she was nontender and these were nonpalpable so I do not think these are the etiology. She has already had greater than 6 weeks of physician directed conservative measures so we will proceed with MRI for epidural planning as well.  Plantar fasciitis, left Doing well after injection back in June.  Primary osteoarthritis of both knees At this point we do need to get her approved for Orthovisc, she has failed bilateral steroid injections and still has persistent pain. We will also see if the rep can get her some samples.  Pulmonary embolism (HCC) Plan right now is for lifelong Eliquis. Repeating echocardiogram to ensure cor pulmonale has resolved. She does have follow-up with hematology.    ___________________________________________ Ihor Austin. Benjamin Stain, M.D., ABFM., CAQSM. Primary Care and Sports Medicine [facility]  Adjunct Instructor of Family Medicine  [employer]

## 2019-10-08 NOTE — Assessment & Plan Note (Signed)
Due for cervical cancer screening, she will do it here with one of our female providers.

## 2019-10-08 NOTE — Assessment & Plan Note (Signed)
Doing well after injection back in June.

## 2019-10-12 ENCOUNTER — Ambulatory Visit (INDEPENDENT_AMBULATORY_CARE_PROVIDER_SITE_OTHER): Payer: No Typology Code available for payment source

## 2019-10-12 ENCOUNTER — Other Ambulatory Visit: Payer: Self-pay

## 2019-10-12 DIAGNOSIS — M5136 Other intervertebral disc degeneration, lumbar region: Secondary | ICD-10-CM | POA: Diagnosis not present

## 2019-10-26 ENCOUNTER — Other Ambulatory Visit: Payer: Self-pay | Admitting: Sports Medicine

## 2019-10-26 ENCOUNTER — Other Ambulatory Visit (HOSPITAL_BASED_OUTPATIENT_CLINIC_OR_DEPARTMENT_OTHER): Payer: No Typology Code available for payment source

## 2019-10-26 MED ORDER — SYNVISC 16 MG/2ML IX SOSY: PREFILLED_SYRINGE | INTRA_ARTICULAR | 3 refills | Status: DC

## 2019-10-28 ENCOUNTER — Other Ambulatory Visit: Payer: Self-pay

## 2019-10-28 ENCOUNTER — Ambulatory Visit (HOSPITAL_BASED_OUTPATIENT_CLINIC_OR_DEPARTMENT_OTHER)
Admission: RE | Admit: 2019-10-28 | Discharge: 2019-10-28 | Disposition: A | Discharge status: Home / Self Care | Payer: No Typology Code available for payment source | Source: Ambulatory Visit | Attending: Nurse Practitioner | Admitting: Nurse Practitioner

## 2019-10-28 DIAGNOSIS — I2609 Other pulmonary embolism with acute cor pulmonale: Secondary | ICD-10-CM | POA: Diagnosis present

## 2019-10-28 LAB — ECHOCARDIOGRAM COMPLETE
Area-P 1/2: 3.65 cm2
P 1/2 time: 973 msec
S' Lateral: 2.83 cm

## 2019-10-28 NOTE — Telephone Encounter (Signed)
Patient approved for Synvisc with no PA will call and schedule for appointments once medication is received. - CF

## 2019-10-29 NOTE — Telephone Encounter (Signed)
I spoke with patient to let her know that Synvisc was approved through her insurance and we were waiting on the medication to arrive and someone would call and schedule her as soon as medication was received. - CF

## 2019-11-16 NOTE — Telephone Encounter (Signed)
Patient calling in wanting to know if the medication is in so she can make her appointment. Please advise.

## 2019-11-20 ENCOUNTER — Telehealth: Payer: Self-pay | Admitting: Sports Medicine

## 2019-11-20 DIAGNOSIS — M170 Bilateral primary osteoarthritis of knee: Secondary | ICD-10-CM

## 2019-11-20 NOTE — Telephone Encounter (Signed)
Received fax and medication now requires PA and diagnosis code. I sent PA through cover my meds waiting on determination. I will call specialty pharmacy with diagnosis code and determination as soon as I know. - CF

## 2019-11-23 NOTE — Telephone Encounter (Signed)
Received fax from [employer] and they denied coverage on Synvisc due to medication and or diagnosis are not covered benefits and excluded from coverage on the benefit plan. Placing in providers box for review. - CF

## 2019-11-29 ENCOUNTER — Other Ambulatory Visit: Payer: Self-pay | Admitting: Sports Medicine

## 2019-11-29 DIAGNOSIS — F39 Unspecified mood [affective] disorder: Secondary | ICD-10-CM

## 2019-12-01 NOTE — Addendum Note (Signed)
Addended by: Monica Becton on: 12/01/2019 04:46 PM   Modules accepted: Orders

## 2019-12-01 NOTE — Telephone Encounter (Signed)
Unfortunately it is going to be a knee replacement.  I can give her a steroid injection but often times you have to wait 6 to 12 weeks after an injection before knee replacement is able to be done due to the risk of infection.  We could try some pain medication as well, have her let me know.

## 2019-12-01 NOTE — Telephone Encounter (Signed)
Pt called. She stated she had not gotten a call on the status of Synvisc. I told her coverage was denied for Synvisc. She is still in pain and wants to know what is the next step. She also said she wanted if get a cortizone injection in the meantime,  Thank you.

## 2019-12-01 NOTE — Telephone Encounter (Signed)
Spoke with patient, She does not want pain meds.  She would like a referral to see about surgery.  She is aware that they will contact here cirectly after the referral is placed.

## 2019-12-01 NOTE — Telephone Encounter (Signed)
Referral placed.

## 2019-12-02 NOTE — Telephone Encounter (Signed)
Thank you :)

## 2020-01-06 ENCOUNTER — Other Ambulatory Visit: Payer: Self-pay | Admitting: Sports Medicine

## 2020-01-06 DIAGNOSIS — I2609 Other pulmonary embolism with acute cor pulmonale: Secondary | ICD-10-CM

## 2020-02-12 ENCOUNTER — Other Ambulatory Visit: Payer: Self-pay | Admitting: Orthopedic Surgery

## 2020-02-19 NOTE — Progress Notes (Signed)
DUE TO COVID-19 ONLY ONE VISITOR IS ALLOWED TO COME WITH YOU AND STAY IN THE WAITING ROOM ONLY DURING PRE OP AND PROCEDURE DAY OF SURGERY. THE 1 VISITOR  MAY VISIT WITH YOU AFTER SURGERY IN YOUR PRIVATE ROOM DURING VISITING HOURS ONLY!  YOU NEED TO HAVE A COVID 19 TEST ON__1/25/2022 _____ @_______ , THIS TEST MUST BE DONE BEFORE SURGERY,  COVID TESTING SITE [address] [place] , IT IS ON THE RIGHT GOING OUT [address] APPROXIMATELY  2 MINUTES PAST [employer] ON THE RIGHT. ONCE YOUR COVID TEST IS COMPLETED,  PLEASE BEGIN THE QUARANTINE INSTRUCTIONS AS OUTLINED IN YOUR HANDOUT.                Vanessa Snyder  02/19/2020   Your procedure is scheduled on: 02/26/2020     Report to [facility] Main  Entrance   Report to admitting at    1045 AM     Call this number if you have problems the morning of surgery [phone]    REMEMBER: NO  SOLID FOOD CANDY OR GUM AFTER MIDNIGHT. CLEAR LIQUIDS UNTIL         . NOTHING BY MOUTH EXCEPT CLEAR LIQUIDS UNTIL   0945am    . PLEASE FINISH ENSURE DRINK PER SURGEON ORDER  WHICH NEEDS TO BE COMPLETED AT 0945am    .      CLEAR LIQUID DIET   Foods Allowed                                                                    Coffee and tea, regular and decaf                            Fruit ices (not with fruit pulp)                                      Iced Popsicles                                    Carbonated beverages, regular and diet                                    Cranberry, grape and apple juices Sports drinks like Gatorade Lightly seasoned clear broth or consume(fat free) Sugar, honey syrup ___________________________________________________________________      BRUSH YOUR TEETH MORNING OF SURGERY AND RINSE YOUR MOUTH OUT, NO CHEWING GUM CANDY OR MINTS.     Take these medicines the morning of surgery with A SIP OF WATER: gabapentin, zoloft   DO NOT TAKE ANY DIABETIC MEDICATIONS DAY OF YOUR  SURGERY                               You may not have any metal on your body including hair pins and              piercings  Do  not wear jewelry, make-up, lotions, powders or perfumes, deodorant             Do not wear nail polish on your fingernails.  Do not shave  48 hours prior to surgery.              Men may shave face and neck.   Do not bring valuables to the hospital. [place].  Contacts, dentures or bridgework may not be worn into surgery.  Leave suitcase in the car. After surgery it may be brought to your room.     Patients discharged the day of surgery will not be allowed to drive home. IF YOU ARE HAVING SURGERY AND GOING HOME THE SAME DAY, YOU MUST HAVE AN ADULT TO DRIVE YOU HOME AND BE WITH YOU FOR 24 HOURS. YOU MAY GO HOME BY TAXI OR UBER OR ORTHERWISE, BUT AN ADULT MUST ACCOMPANY YOU HOME AND STAY WITH YOU FOR 24 HOURS.  Name and phone number of your driver:  Special Instructions: N/A              Please read over the following fact sheets you were given: _____________________________________________________________________  [facility] - Preparing for Surgery Before surgery, you can play an important role.  Because skin is not sterile, your skin needs to be as free of germs as possible.  You can reduce the number of germs on your skin by washing with CHG (chlorahexidine gluconate) soap before surgery.  CHG is an antiseptic cleaner which kills germs and bonds with the skin to continue killing germs even after washing. Please DO NOT use if you have an allergy to CHG or antibacterial soaps.  If your skin becomes reddened/irritated stop using the CHG and inform your nurse when you arrive at Short Stay. Do not shave (including legs and underarms) for at least 48 hours prior to the first CHG shower.  You may shave your face/neck. Please follow these instructions carefully:  1.  Shower with CHG Soap the night before surgery and the   morning of Surgery.  2.  If you choose to wash your hair, wash your hair first as usual with your  normal  shampoo.  3.  After you shampoo, rinse your hair and body thoroughly to remove the  shampoo.                           4.  Use CHG as you would any other liquid soap.  You can apply chg directly  to the skin and wash                       Gently with a scrungie or clean washcloth.  5.  Apply the CHG Soap to your body ONLY FROM THE NECK DOWN.   Do not use on face/ open                           Wound or open sores. Avoid contact with eyes, ears mouth and genitals (private parts).                       Wash face,  Genitals (private parts) with your normal soap.             6.  Wash thoroughly,  paying special attention to the area where your surgery  will be performed.  7.  Thoroughly rinse your body with warm water from the neck down.  8.  DO NOT shower/wash with your normal soap after using and rinsing off  the CHG Soap.                9.  Pat yourself dry with a clean towel.            10.  Wear clean pajamas.            11.  Place clean sheets on your bed the night of your first shower and do not  sleep with pets. Day of Surgery : Do not apply any lotions/deodorants the morning of surgery.  Please wear clean clothes to the hospital/surgery center.  FAILURE TO FOLLOW THESE INSTRUCTIONS MAY RESULT IN THE CANCELLATION OF YOUR SURGERY PATIENT SIGNATURE_________________________________  NURSE SIGNATURE__________________________________  ________________________________________________________________________

## 2020-02-22 NOTE — Progress Notes (Addendum)
         Anesthesia Review: Hx of chronic pe  01/20/2020- [facility] hematology  PCP:  DR Briant Sites 10/08/19- L[facility]  Visit scheduled for today on 02/23/20 Cardiologist : Chest x-ray : 07/02/19- CT Angio Pulm  E[facility] : [facility]  Requested 12 lead ekg tracings  Echo :10/28/2019  Stress test: Cardiac Cath : 07/03/19  Activity level:  Sleep Study/ CPAP : Fasting Blood Sugar :      / Checks Blood Sugar -- times a day:   Blood Thinner/ Instructions /Last Dose: ASA / Instructions/ Last Dose :  eliquis - per pt dr was told she could stay on Eliquis To speak with Dr Briant Sites on 02/23/2020 at appt  See office visit note regarding blood thinner on 02/23/2020- Dr Lovenia Shuck .

## 2020-02-23 ENCOUNTER — Encounter (HOSPITAL_COMMUNITY)
Admission: RE | Admit: 2020-02-23 | Discharge: 2020-02-23 | Disposition: A | Discharge status: Home / Self Care | Payer: No Typology Code available for payment source | Source: Ambulatory Visit | Attending: Orthopedic Surgery | Admitting: Orthopedic Surgery

## 2020-02-23 ENCOUNTER — Encounter (HOSPITAL_COMMUNITY): Payer: Self-pay

## 2020-02-23 ENCOUNTER — Other Ambulatory Visit (HOSPITAL_COMMUNITY)
Admission: RE | Admit: 2020-02-23 | Discharge: 2020-02-23 | Disposition: A | Discharge status: Home / Self Care | Payer: No Typology Code available for payment source | Source: Ambulatory Visit | Attending: Orthopedic Surgery | Admitting: Orthopedic Surgery

## 2020-02-23 ENCOUNTER — Other Ambulatory Visit: Payer: Self-pay

## 2020-02-23 ENCOUNTER — Ambulatory Visit (INDEPENDENT_AMBULATORY_CARE_PROVIDER_SITE_OTHER): Payer: No Typology Code available for payment source | Admitting: Sports Medicine

## 2020-02-23 ENCOUNTER — Encounter: Payer: Self-pay | Admitting: Sports Medicine

## 2020-02-23 VITALS — BP 85/54 | HR 64 | Ht 68.0 in | Wt 172.0 lb

## 2020-02-23 DIAGNOSIS — Z01818 Encounter for other preprocedural examination: Secondary | ICD-10-CM | POA: Diagnosis not present

## 2020-02-23 DIAGNOSIS — Z01812 Encounter for preprocedural laboratory examination: Secondary | ICD-10-CM | POA: Diagnosis not present

## 2020-02-23 DIAGNOSIS — U071 COVID-19: Secondary | ICD-10-CM | POA: Insufficient documentation

## 2020-02-23 DIAGNOSIS — M170 Bilateral primary osteoarthritis of knee: Secondary | ICD-10-CM

## 2020-02-23 HISTORY — DX: Unspecified osteoarthritis, unspecified site: M19.90

## 2020-02-23 HISTORY — DX: Anxiety disorder, unspecified: F41.9

## 2020-02-23 HISTORY — DX: Chronic pulmonary embolism: I27.82

## 2020-02-23 LAB — CBC
HCT: 41.2 % (ref 36.0–46.0)
Hemoglobin: 13.5 g/dL (ref 12.0–15.0)
MCH: 32.1 pg (ref 26.0–34.0)
MCHC: 32.8 g/dL (ref 30.0–36.0)
MCV: 97.9 fL (ref 80.0–100.0)
Platelets: 270 10*3/uL (ref 150–400)
RBC: 4.21 MIL/uL (ref 3.87–5.11)
RDW: 13.1 % (ref 11.5–15.5)
WBC: 4.8 10*3/uL (ref 4.0–10.5)
nRBC: 0 % (ref 0.0–0.2)

## 2020-02-23 LAB — BASIC METABOLIC PANEL
Anion gap: 10 (ref 5–15)
BUN: 15 mg/dL (ref 8–23)
CO2: 27 mmol/L (ref 22–32)
Calcium: 9.8 mg/dL (ref 8.9–10.3)
Chloride: 103 mmol/L (ref 98–111)
Creatinine, Ser: 0.46 mg/dL (ref 0.44–1.00)
GFR, Estimated: >60 mL/min (ref >60)
Glucose, Bld: 92 mg/dL (ref 70–99)
Potassium: 4.3 mmol/L (ref 3.5–5.1)
Sodium: 140 mmol/L (ref 135–145)

## 2020-02-23 LAB — SARS CORONAVIRUS 2 (TAT 6-24 HRS): SARS Coronavirus 2: POSITIVE — AB

## 2020-02-23 NOTE — Assessment & Plan Note (Signed)
Bilateral knee arthroscopy coming up with Dr. Luiz Blare on Friday, we did her surgical clearance today, she has greater than 4 metabolic equivalents of cardiac capacity, ECG shows normal sinus rhythm, she does have some left axis deviation, otherwise no ST, PR segment abnormalities. She had a echocardiogram in September after a large pulmonary embolism with cor pulmonale, this echocardiogram showed normalization of ventricular function,  She can stop her Xarelto 2 days prior to surgery and restart postop day 1.

## 2020-02-23 NOTE — Progress Notes (Signed)
    Procedures performed today:    Twelve-lead ECG performed and interpreted, normal sinus rhythm with a rate of 57 bpm, left axis deviation, otherwise no PR or ST changes.  Independent interpretation of notes and tests performed by another provider:   None.  Brief History, Exam, Impression, and Recommendations:    Primary osteoarthritis of both knees Bilateral knee arthroscopy coming up with Dr. Luiz Blare on Friday, we did her surgical clearance today, she has greater than 4 metabolic equivalents of cardiac capacity, ECG shows normal sinus rhythm, she does have some left axis deviation, otherwise no ST, PR segment abnormalities. She had a echocardiogram in September after a large pulmonary embolism with cor pulmonale, this echocardiogram showed normalization of ventricular function,  She can stop her Xarelto 2 days prior to surgery and restart postop day 1.     ___________________________________________ Ihor Austin. Benjamin Stain, M.D., ABFM., CAQSM. Primary Care and Sports Medicine  [facility]  Adjunct Instructor of Family Medicine  [employer]

## 2020-02-24 NOTE — Progress Notes (Signed)
Patient with positive covid result. Contacted MD and informed of result.   

## 2020-02-26 ENCOUNTER — Other Ambulatory Visit: Payer: Self-pay | Admitting: Orthopedic Surgery

## 2020-03-02 ENCOUNTER — Other Ambulatory Visit: Payer: Self-pay | Admitting: Sports Medicine

## 2020-03-08 NOTE — Progress Notes (Signed)
Ms.Cuffie aware to arrive at 1030 AM 03/18/20, reviewed instructions, verbalized understanding.  Last dose of Eliquis will be 03/16/20.

## 2020-03-18 ENCOUNTER — Ambulatory Visit (HOSPITAL_COMMUNITY): Payer: No Typology Code available for payment source | Admitting: Anesthesiology

## 2020-03-18 ENCOUNTER — Ambulatory Visit (HOSPITAL_COMMUNITY)
Admission: RE | Admit: 2020-03-18 | Discharge: 2020-03-18 | Disposition: A | Discharge status: Home / Self Care | Payer: No Typology Code available for payment source | Attending: Orthopedic Surgery | Admitting: Orthopedic Surgery

## 2020-03-18 ENCOUNTER — Ambulatory Visit (HOSPITAL_COMMUNITY): Payer: No Typology Code available for payment source | Admitting: Physician Assistant

## 2020-03-18 ENCOUNTER — Encounter (HOSPITAL_COMMUNITY): Payer: Self-pay | Admitting: Orthopedic Surgery

## 2020-03-18 ENCOUNTER — Encounter (HOSPITAL_COMMUNITY)
Admission: RE | Disposition: A | Discharge status: Home / Self Care | Payer: Self-pay | Source: Home / Self Care | Attending: Orthopedic Surgery

## 2020-03-18 DIAGNOSIS — S83281A Other tear of lateral meniscus, current injury, right knee, initial encounter: Secondary | ICD-10-CM | POA: Diagnosis not present

## 2020-03-18 DIAGNOSIS — Z79899 Other long term (current) drug therapy: Secondary | ICD-10-CM | POA: Diagnosis not present

## 2020-03-18 DIAGNOSIS — S83242A Other tear of medial meniscus, current injury, left knee, initial encounter: Secondary | ICD-10-CM | POA: Insufficient documentation

## 2020-03-18 DIAGNOSIS — M170 Bilateral primary osteoarthritis of knee: Secondary | ICD-10-CM | POA: Diagnosis present

## 2020-03-18 DIAGNOSIS — Z7901 Long term (current) use of anticoagulants: Secondary | ICD-10-CM | POA: Diagnosis not present

## 2020-03-18 DIAGNOSIS — X58XXXA Exposure to other specified factors, initial encounter: Secondary | ICD-10-CM | POA: Insufficient documentation

## 2020-03-18 DIAGNOSIS — M6752 Plica syndrome, left knee: Secondary | ICD-10-CM | POA: Diagnosis not present

## 2020-03-18 DIAGNOSIS — M6751 Plica syndrome, right knee: Secondary | ICD-10-CM | POA: Insufficient documentation

## 2020-03-18 DIAGNOSIS — M2242 Chondromalacia patellae, left knee: Secondary | ICD-10-CM | POA: Diagnosis not present

## 2020-03-18 DIAGNOSIS — Z87891 Personal history of nicotine dependence: Secondary | ICD-10-CM | POA: Insufficient documentation

## 2020-03-18 DIAGNOSIS — S83282A Other tear of lateral meniscus, current injury, left knee, initial encounter: Secondary | ICD-10-CM | POA: Insufficient documentation

## 2020-03-18 DIAGNOSIS — Z791 Long term (current) use of non-steroidal anti-inflammatories (NSAID): Secondary | ICD-10-CM | POA: Insufficient documentation

## 2020-03-18 DIAGNOSIS — M2241 Chondromalacia patellae, right knee: Secondary | ICD-10-CM | POA: Diagnosis not present

## 2020-03-18 DIAGNOSIS — S83241A Other tear of medial meniscus, current injury, right knee, initial encounter: Secondary | ICD-10-CM | POA: Diagnosis not present

## 2020-03-18 HISTORY — PX: KNEE ARTHROSCOPY: SHX127

## 2020-03-18 LAB — BASIC METABOLIC PANEL
Anion gap: 12 (ref 5–15)
BUN: 11 mg/dL (ref 8–23)
CO2: 25 mmol/L (ref 22–32)
Calcium: 9.3 mg/dL (ref 8.9–10.3)
Chloride: 104 mmol/L (ref 98–111)
Creatinine, Ser: 0.7 mg/dL (ref 0.44–1.00)
GFR, Estimated: >60 mL/min (ref >60)
Glucose, Bld: 79 mg/dL (ref 70–99)
Potassium: 5.5 mmol/L — ABNORMAL HIGH (ref 3.5–5.1)
Sodium: 141 mmol/L (ref 135–145)

## 2020-03-18 LAB — CBC
HCT: 40.9 % (ref 36.0–46.0)
Hemoglobin: 13.3 g/dL (ref 12.0–15.0)
MCH: 32.2 pg (ref 26.0–34.0)
MCHC: 32.5 g/dL (ref 30.0–36.0)
MCV: 99 fL (ref 80.0–100.0)
Platelets: 276 10*3/uL (ref 150–400)
RBC: 4.13 MIL/uL (ref 3.87–5.11)
RDW: 13.3 % (ref 11.5–15.5)
WBC: 5.1 10*3/uL (ref 4.0–10.5)
nRBC: 0 % (ref 0.0–0.2)

## 2020-03-18 SURGERY — ARTHROSCOPY, KNEE
Anesthesia: General | Site: Knee | Laterality: Bilateral

## 2020-03-18 MED ORDER — SCOPOLAMINE 1 MG/3DAYS TD PT72
1.0000 | MEDICATED_PATCH | Freq: Once | TRANSDERMAL | Status: DC
  Administered 2020-03-18: 1.5 mg via TRANSDERMAL
  Filled 2020-03-18: qty 1

## 2020-03-18 MED ORDER — SODIUM CHLORIDE 0.9 % IR SOLN
Status: DC | PRN
  Administered 2020-03-18 (×3): 3000 mL

## 2020-03-18 MED ORDER — KETOROLAC TROMETHAMINE 30 MG/ML IJ SOLN
30.0000 mg | Freq: Once | INTRAMUSCULAR | Status: AC
  Administered 2020-03-18: 30 mg via INTRAVENOUS

## 2020-03-18 MED ORDER — MIDAZOLAM HCL 5 MG/5ML IJ SOLN
INTRAMUSCULAR | Status: DC | PRN
  Administered 2020-03-18: 2 mg via INTRAVENOUS

## 2020-03-18 MED ORDER — PROMETHAZINE HCL 25 MG/ML IJ SOLN: 6.2500 mg | INTRAMUSCULAR | Status: DC | PRN

## 2020-03-18 MED ORDER — BUPIVACAINE HCL (PF) 0.5 % IJ SOLN
INTRAMUSCULAR | Status: AC
  Filled 2020-03-18: qty 30

## 2020-03-18 MED ORDER — MIDAZOLAM HCL 2 MG/2ML IJ SOLN
INTRAMUSCULAR | Status: AC
  Filled 2020-03-18: qty 2

## 2020-03-18 MED ORDER — EPINEPHRINE PF 1 MG/ML IJ SOLN
INTRAMUSCULAR | Status: AC
  Filled 2020-03-18: qty 2

## 2020-03-18 MED ORDER — FENTANYL CITRATE (PF) 100 MCG/2ML IJ SOLN
INTRAMUSCULAR | Status: DC | PRN
  Administered 2020-03-18 (×4): 25 ug via INTRAVENOUS

## 2020-03-18 MED ORDER — CHLORHEXIDINE GLUCONATE 0.12 % MT SOLN
15.0000 mL | Freq: Once | OROMUCOSAL | Status: AC
  Administered 2020-03-18: 15 mL via OROMUCOSAL

## 2020-03-18 MED ORDER — ACETAMINOPHEN 500 MG PO TABS
1000.0000 mg | ORAL_TABLET | Freq: Once | ORAL | Status: AC
  Administered 2020-03-18: 1000 mg via ORAL
  Filled 2020-03-18: qty 2

## 2020-03-18 MED ORDER — KETOROLAC TROMETHAMINE 30 MG/ML IJ SOLN
INTRAMUSCULAR | Status: AC
  Filled 2020-03-18: qty 1

## 2020-03-18 MED ORDER — GLYCOPYRROLATE 0.2 MG/ML IJ SOLN
INTRAMUSCULAR | Status: DC | PRN
  Administered 2020-03-18: .2 mg via INTRAVENOUS

## 2020-03-18 MED ORDER — GLYCOPYRROLATE PF 0.2 MG/ML IJ SOSY
PREFILLED_SYRINGE | INTRAMUSCULAR | Status: AC
  Filled 2020-03-18: qty 1

## 2020-03-18 MED ORDER — CEFAZOLIN SODIUM-DEXTROSE 2-4 GM/100ML-% IV SOLN
2.0000 g | INTRAVENOUS | Status: AC
  Administered 2020-03-18: 2 g via INTRAVENOUS
  Filled 2020-03-18: qty 100

## 2020-03-18 MED ORDER — PROPOFOL 10 MG/ML IV BOLUS
INTRAVENOUS | Status: DC | PRN
  Administered 2020-03-18: 170 mg via INTRAVENOUS
  Administered 2020-03-18: 30 mg via INTRAVENOUS

## 2020-03-18 MED ORDER — DEXAMETHASONE SODIUM PHOSPHATE 10 MG/ML IJ SOLN
INTRAMUSCULAR | Status: DC | PRN
  Administered 2020-03-18: 5 mg via INTRAVENOUS

## 2020-03-18 MED ORDER — LIDOCAINE HCL (PF) 2 % IJ SOLN
INTRAMUSCULAR | Status: AC
  Filled 2020-03-18: qty 5

## 2020-03-18 MED ORDER — LIDOCAINE 2% (20 MG/ML) 5 ML SYRINGE
INTRAMUSCULAR | Status: DC | PRN
  Administered 2020-03-18: 80 mg via INTRAVENOUS

## 2020-03-18 MED ORDER — ONDANSETRON HCL 4 MG/2ML IJ SOLN
INTRAMUSCULAR | Status: DC | PRN
  Administered 2020-03-18: 4 mg via INTRAVENOUS

## 2020-03-18 MED ORDER — FENTANYL CITRATE (PF) 100 MCG/2ML IJ SOLN
INTRAMUSCULAR | Status: AC
  Filled 2020-03-18: qty 2

## 2020-03-18 MED ORDER — FENTANYL CITRATE (PF) 100 MCG/2ML IJ SOLN
INTRAMUSCULAR | Status: AC
  Filled 2020-03-18: qty 4

## 2020-03-18 MED ORDER — ORAL CARE MOUTH RINSE: 15.0000 mL | Freq: Once | OROMUCOSAL | Status: AC

## 2020-03-18 MED ORDER — BUPIVACAINE HCL (PF) 0.5 % IJ SOLN
INTRAMUSCULAR | Status: DC | PRN
  Administered 2020-03-18: 20 mL

## 2020-03-18 MED ORDER — EPINEPHRINE PF 1 MG/ML IJ SOLN
INTRAMUSCULAR | Status: DC | PRN
  Administered 2020-03-18: 1 mg

## 2020-03-18 MED ORDER — LACTATED RINGERS IV SOLN: INTRAVENOUS | Status: DC

## 2020-03-18 MED ORDER — FENTANYL CITRATE (PF) 100 MCG/2ML IJ SOLN
25.0000 ug | INTRAMUSCULAR | Status: DC | PRN
  Administered 2020-03-18 (×2): 50 ug via INTRAVENOUS
  Administered 2020-03-18: 25 ug via INTRAVENOUS

## 2020-03-18 SURGICAL SUPPLY — 33 items
BLADE EXCALIBUR 4.0MM X 13CM (MISCELLANEOUS)
BLADE EXCALIBUR 4.0X13 (MISCELLANEOUS) IMPLANT
BNDG ELASTIC 6X5.8 VLCR STR LF (GAUZE/BANDAGES/DRESSINGS) ×3 IMPLANT
BOOTIES KNEE HIGH SLOAN (MISCELLANEOUS) ×6 IMPLANT
COVER WAND RF STERILE (DRAPES) IMPLANT
DISSECTOR 4.0MM X 13CM (MISCELLANEOUS) IMPLANT
DRAPE BILATERAL LIMB T (DRAPES) IMPLANT
DRAPE U-SHAPE 47X51 STRL (DRAPES) ×3 IMPLANT
DRSG EMULSION OIL 3X3 NADH (GAUZE/BANDAGES/DRESSINGS) ×6 IMPLANT
DRSG PAD ABDOMINAL 8X10 ST (GAUZE/BANDAGES/DRESSINGS) ×6 IMPLANT
DURAPREP 26ML APPLICATOR (WOUND CARE) ×3 IMPLANT
DW OUTFLOW CASSETTE/TUBE SET (MISCELLANEOUS) ×3 IMPLANT
FILTER STRAW (MISCELLANEOUS) ×3 IMPLANT
GAUZE SPONGE 4X4 12PLY STRL (GAUZE/BANDAGES/DRESSINGS) ×6 IMPLANT
GLOVE SRG 8 PF TXTR STRL LF DI (GLOVE) ×2 IMPLANT
GLOVE SURG LTX SZ7.5 (GLOVE) ×6 IMPLANT
GLOVE SURG UNDER POLY LF SZ8 (GLOVE) ×4
GOWN STRL REUS W/TWL XL LVL3 (GOWN DISPOSABLE) ×6 IMPLANT
KIT BASIN OR (CUSTOM PROCEDURE TRAY) ×3 IMPLANT
KIT TURNOVER KIT A (KITS) ×3 IMPLANT
MANIFOLD NEPTUNE II (INSTRUMENTS) ×3 IMPLANT
NDL SAFETY ECLIPSE 18X1.5 (NEEDLE) ×1 IMPLANT
NEEDLE HYPO 18GX1.5 SHARP (NEEDLE) ×2
NEEDLE HYPO 25X1 1.5 SAFETY (NEEDLE) ×3 IMPLANT
PACK ARTHROSCOPY WL (CUSTOM PROCEDURE TRAY) ×3 IMPLANT
PAD MASON LEG HOLDER (PIN) IMPLANT
PADDING CAST COTTON 6X4 STRL (CAST SUPPLIES) ×9 IMPLANT
PENCIL SMOKE EVACUATOR (MISCELLANEOUS) IMPLANT
PORT APPOLLO RF 90DEGREE MULTI (SURGICAL WAND) ×3 IMPLANT
SUT ETHILON 4 0 PS 2 18 (SUTURE) IMPLANT
SYR 3ML LL SCALE MARK (SYRINGE) ×3 IMPLANT
TUBING ARTHROSCOPY IRRIG 16FT (MISCELLANEOUS) ×3 IMPLANT
WRAP KNEE MAXI GEL POST OP (GAUZE/BANDAGES/DRESSINGS) ×3 IMPLANT

## 2020-03-18 NOTE — H&P (Signed)
A pre op hand p   Chief Complaint: Bilateral knee pain  HPI: Vanessa Snyder is a 60 y.o. female who presents for evaluation of bilateral knee pain. It has been present for greater than 3 months and has been worsening. She has failed conservative measures. Pain is rated as moderate.  Past Medical History:  Diagnosis Date  . Anxiety   . Arthritis   . Chronic pulmonary embolism [facility])    Past Surgical History:  Procedure Laterality Date  . APPENDECTOMY    . AUGMENTATION MAMMAPLASTY    . CESAREAN SECTION    . PLACEMENT OF BREAST IMPLANTS    . TONSILLECTOMY     Social History   Socioeconomic History  . Marital status: Married    Spouse name: Not on file  . Number of children: Not on file  . Years of education: Not on file  . Highest education level: Not on file  Occupational History  . Not on file  Tobacco Use  . Smoking status: Former Games developer  . Smokeless tobacco: Never Used  . Tobacco comment: auit 27 years ago   Vaping Use  . Vaping Use: Never used  Substance and Sexual Activity  . Alcohol use: Yes    Alcohol/week: 0.0 standard drinks    Comment: occas   . Drug use: Never  . Sexual activity: Not on file  Other Topics Concern  . Not on file  Social History Narrative  . Not on file   Social Determinants of Health   Financial Resource Strain: Not on file  Food Insecurity: Not on file  Transportation Needs: Not on file  Physical Activity: Not on file  Stress: Not on file  Social Connections: Not on file   No family history on file. No Known Allergies Prior to Admission medications   Medication Sig Start Date End Date Taking? Authorizing Provider  ELIQUIS 5 MG TABS tablet TAKE 1 TABLET BY MOUTH TWICE A DAY Patient taking differently: Take 5 mg by mouth 2 (two) times daily. 01/06/20  Yes Monica Becton, MD  gabapentin (NEURONTIN) 300 MG capsule One tab PO qHS for a week, then BID for a week, then TID. May double weekly to a max of 3,600mg /day Patient taking  differently: Take 300 mg by mouth 2 (two) times daily. 10/08/19  Yes Monica Becton, MD  Misc Natural Products (GLUCOSAMINE CHOND COMPLEX/MSM PO) Take 1 tablet by mouth daily.   Yes [provider]  Multiple Vitamins-Minerals (MULTIVITAMIN WITH MINERALS) tablet Take 1 tablet by mouth daily.   Yes [provider]  Omega-3 Fatty Acids (FISH OIL PO) Take 1,400 mg by mouth daily.   Yes [provider]  sertraline (ZOLOFT) 25 MG tablet TAKE 1 TABLET BY MOUTH EVERY DAY Patient taking differently: Take 25 mg by mouth daily. 11/30/19  Yes Monica Becton, MD  Hylan (SYNVISC) 16 MG/2ML SOSY Inject 1 syringe into both knees weekly x3, dx bilateral knee OA 10/26/19   Monica Becton, MD  meloxicam (MOBIC) 15 MG tablet TAKE 1 TABLET BY MOUTH EVERY DAY 03/02/20   Monica Becton, MD     Positive ROS: None  All other systems have been reviewed and were otherwise negative with the exception of those mentioned in the HPI and as above.  Physical Exam: There were no vitals filed for this visit.  General: Alert, no acute distress Cardiovascular: No pedal edema Respiratory: No cyanosis, no use of accessory musculature GI: No organomegaly, abdomen is soft and non-tender  Skin: No lesions in the area of chief complaint Neurologic: Sensation intact distally Psychiatric: Patient is competent for consent with normal mood and affect Lymphatic: No axillary or cervical lymphadenopathy  MUSCULOSKELETAL: Right knee tender palpation of the medial joint line.  Positive Murray.  No instability.  Trace effusion. Left knee: Tender to palpation over the medial joint line.  Positive Murray.  No instability.  Trace effusion.  MRI: MRI right and left knees show medial meniscal tear with chondromalacia  Assessment/Plan: BILATERAL KNEE MEDIAL MENISCUS TEAR Plan for Procedure(s): ARTHROSCOPY KNEE  The risks benefits and alternatives were discussed with the patient  including but not limited to the risks of nonoperative treatment, versus surgical intervention including infection, bleeding, nerve injury, malunion, nonunion, hardware prominence, hardware failure, need for hardware removal, blood clots, cardiopulmonary complications, morbidity, mortality, among others, and they were willing to proceed.  Predicted outcome is good, although there will be at least a six to nine month expected recovery.  Harvie Junior, MD 03/18/2020 7:15 AM

## 2020-03-18 NOTE — Anesthesia Postprocedure Evaluation (Signed)
Anesthesia Post Note  Patient: Vanessa Snyder  Procedure(s) Performed: Left Knee partial medial menisectomy. Medial and Patella femoral chondroplasty. Medial plica. Right knee partial lateral medial menisectomy. Medial patella and femoral chondroplasty, medial plica excison. (Bilateral Knee)     Patient location during evaluation: PACU Anesthesia Type: General Level of consciousness: awake and alert Pain management: pain level controlled Vital Signs Assessment: post-procedure vital signs reviewed and stable Respiratory status: spontaneous breathing, nonlabored ventilation and respiratory function stable Cardiovascular status: blood pressure returned to baseline and stable Postop Assessment: no apparent nausea or vomiting Anesthetic complications: no   No complications documented.  Last Vitals:  Vitals:   03/18/20 1345 03/18/20 1401  BP: 124/73 119/84  Pulse: 74 60  Resp: 15 16  Temp:  36.7 C  SpO2: 98% 100%    Last Pain:  Vitals:   03/18/20 1401  TempSrc: Oral  PainSc: 0-No pain                 Cecile Hearing

## 2020-03-18 NOTE — Anesthesia Preprocedure Evaluation (Addendum)
Anesthesia Evaluation  Patient identified by MRN, date of birth, ID band Patient awake    Reviewed: Allergy & Precautions, NPO status , Patient's Chart, lab work & pertinent test results  Airway Mallampati: II  TM Distance: >3 FB Neck ROM: Full    Dental  (+) Teeth Intact, Dental Advisory Given   Pulmonary former smoker, PE COVID + 02/23/20   Pulmonary exam normal breath sounds clear to auscultation       Cardiovascular Exercise Tolerance: Good negative cardio ROS Normal cardiovascular exam Rhythm:Regular Rate:Normal     Neuro/Psych PSYCHIATRIC DISORDERS Anxiety negative neurological ROS     GI/Hepatic negative GI ROS, Neg liver ROS,   Endo/Other  negative endocrine ROS  Renal/GU negative Renal ROS     Musculoskeletal  (+) Arthritis , BILATERAL KNEE MEDIAL MENISCUS TEAR   Abdominal   Peds  Hematology  (+) Blood dyscrasia (Eliquis), ,   Anesthesia Other Findings Day of surgery medications reviewed with the patient.  Reproductive/Obstetrics                            Anesthesia Physical Anesthesia Plan  ASA: II  Anesthesia Plan: General   Post-op Pain Management:    Induction: Intravenous  PONV Risk Score and Plan: 4 or greater and Scopolamine patch - Pre-op, Midazolam, Dexamethasone and Ondansetron  Airway Management Planned: LMA  Additional Equipment:   Intra-op Plan:   Post-operative Plan: Extubation in OR  Informed Consent: I have reviewed the patients History and Physical, chart, labs and discussed the procedure including the risks, benefits and alternatives for the proposed anesthesia with the patient or authorized representative who has indicated his/her understanding and acceptance.     Dental advisory given  Plan Discussed with: CRNA  Anesthesia Plan Comments:         Anesthesia Quick Evaluation

## 2020-03-18 NOTE — Discharge Instructions (Signed)
-  Restart eliquis beginning tomorrow.  -Keep Ace bandage in place for 2 days. You may remove it on day 3 and apply Band-Aids to the small incisions on both knees.  -Elevate and ice the knees several times a day.  -Bend and straighten the knees frequently.  -You may begin bearing weight today.  -Follow-up with [place] in clinic in 1 week for postoperative evaluation.

## 2020-03-18 NOTE — Transfer of Care (Signed)
Immediate Anesthesia Transfer of Care Note  Patient: Vanessa Snyder  Procedure(s) Performed: Left Knee partial medial menisectomy. Medial and Patella femoral chondroplasty. Medial plica. Right knee partial lateral medial menisectomy. Medial patella and femoral chondroplasty, medial plica excison. (Bilateral Knee)  Patient Location: PACU  Anesthesia Type:General  Level of Consciousness: awake, alert  and oriented  Airway & Oxygen Therapy: Patient Spontanous Breathing and Patient connected to face mask oxygen  Post-op Assessment: Report given to RN and Post -op Vital signs reviewed and stable  Post vital signs: Reviewed and stable  Last Vitals:  Vitals Value Taken Time  BP 165/150 03/18/20 1220  Temp    Pulse 84 03/18/20 1222  Resp    SpO2 100 % 03/18/20 1222  Vitals shown include unvalidated device data.  Last Pain:  Vitals:   03/18/20 0940  TempSrc: Oral  PainSc:          Complications: No complications documented.

## 2020-03-18 NOTE — Anesthesia Procedure Notes (Signed)
Procedure Name: LMA Insertion Date/Time: 03/18/2020 11:13 AM Performed by: Elyn Peers, CRNA Pre-anesthesia Checklist: Patient identified, Emergency Drugs available, Suction available, Patient being monitored and Timeout performed Patient Re-evaluated:Patient Re-evaluated prior to induction Oxygen Delivery Method: Circle system utilized Preoxygenation: Pre-oxygenation with 100% oxygen Induction Type: IV induction Ventilation: Mask ventilation without difficulty LMA: LMA inserted LMA Size: 4.0 Number of attempts: 1 Placement Confirmation: positive ETCO2 and breath sounds checked- equal and bilateral Tube secured with: Tape Dental Injury: Teeth and Oropharynx as per pre-operative assessment

## 2020-03-19 ENCOUNTER — Encounter (HOSPITAL_COMMUNITY): Payer: Self-pay | Admitting: Orthopedic Surgery

## 2020-03-23 NOTE — Op Note (Signed)
NAME: Vanessa Snyder, Vanessa Snyder MEDICAL RECORD [id number] ACCOUNT [id number] DATE OF BIRTH:[birth year] FACILITY: [facility] LOCATION: [facility] PHYSICIAN:Shlome Baldree Starling Manns, MD  OPERATIVE REPORT  DATE OF PROCEDURE:  03/18/2020  PREOPERATIVE DIAGNOSIS:   1.  Right knee medial meniscal tear. 2.  Left knee medial meniscal tear.  POSTOPERATIVE DIAGNOSES: 1.  Left knee medial meniscal tear, medial and patellofemoral chondromalacia, and medial shelf plica. 2.  Right knee medial and lateral meniscal tears, medial and patellofemoral chondromalacia, and medial plica.  PROCEDURE: 1.  Left knee partial medial meniscectomy. 2.  Left knee medial and patellofemoral chondroplasty. 3.  Left knee medial plica excision. 4.  Right knee partial medial and partial lateral meniscectomy. 5.  Medial and patellofemoral chondroplasty. 6.  Medial plica excision, right knee right on the previous one.  SURGEON:  Jodi Geralds, MD  ASSISTANT:  Greggory Stallion, [facility], who was present for the entire case assisted by manipulation of the leg, and closing to minimize OR time.  BRIEF HISTORY:  The patient is a 60 year old female with a history of having had previous pulmonary emboli, who has been on long-term anticoagulant therapy, who has bilateral knee pain.  MRIs of each of her knees showed that she had medial meniscal  tearing and some chondromalacia.  After failure of conservative care including injection therapy and activity modifications, she is ultimately brought to the operating room for operative knee arthroscopy.  DESCRIPTION OF PROCEDURE:  The patient was brought to the operating room and after adequate anesthesia was obtained, the patient was placed supine on the operating table.  Both legs were then prepped and draped in the usual sterile fashion.  Attention  was then turned to the left knee where after routine prep and drape of both knees, arthroscopy of the left knee was performed.  Arthroscopy revealed there was significant  chondromalacia in the medial and patellofemoral compartments and chondroplasty was  performed.  There was an extensive medial meniscal tear, which was identified and debrided.  There was a dense draping medial plica, which was debrided to gain access into the medial compartment.  The ACL was normal.  There was a small tear of the  lateral meniscus in the outer portion, which was debrided.  There was minimal chondromalacia in the lateral compartment.  At this time, the knee was copiously and thoroughly lavaged and suctioned dry and attention was then turned to the right knee.  Routine arthroscopy of the right knee revealed that there was significant chondromalacia of the patellofemoral joint, which was debrided.  There was a large draping medial plica, which was debrided.  There was extensive tearing of the medial meniscus,  which was debrided.  Chondroplasty of the medial compartment was completed as well.  ACL was normal.  Attention turned laterally where there was blocking of entrance into the lateral compartment with a displaced lateral meniscal tear.  The lateral  meniscus was debrided allowing access into the lateral compartment, which had some mild chondromalacia.  At this point, the compartment was irrigated and suctioned dry.  Final check was made throughout the knee making sure there was no loose and  fragmented pieces of cartilage.  At this point, the knee was drained and suctioned dry.  Both knees then had one quarter % Marcaine instilled into the knees for postoperative anesthesia.  She was then taken to the recovery room.  She was noted to be in  satisfactory condition.  Sterile compressive dressings were applied to each knee prior to going to the recovery room.  Estimated  blood loss for the procedure was minimal.  COMPLICATIONS:  None.  HN/NUANCE  D:03/22/2020 T:03/23/2020 JOB:[id number]

## 2020-04-04 ENCOUNTER — Other Ambulatory Visit (HOSPITAL_COMMUNITY): Payer: Self-pay | Admitting: Family Medicine

## 2020-04-04 ENCOUNTER — Ambulatory Visit (HOSPITAL_COMMUNITY)
Admission: RE | Admit: 2020-04-04 | Discharge: 2020-04-04 | Disposition: A | Discharge status: Home / Self Care | Payer: No Typology Code available for payment source | Source: Ambulatory Visit | Attending: Family Medicine | Admitting: Family Medicine

## 2020-04-04 ENCOUNTER — Other Ambulatory Visit: Payer: Self-pay

## 2020-04-04 DIAGNOSIS — M79661 Pain in right lower leg: Secondary | ICD-10-CM | POA: Insufficient documentation

## 2020-04-05 ENCOUNTER — Other Ambulatory Visit: Payer: Self-pay | Admitting: Sports Medicine

## 2020-04-05 DIAGNOSIS — M5136 Other intervertebral disc degeneration, lumbar region: Secondary | ICD-10-CM

## 2020-04-05 DIAGNOSIS — F39 Unspecified mood [affective] disorder: Secondary | ICD-10-CM

## 2020-04-05 DIAGNOSIS — I2609 Other pulmonary embolism with acute cor pulmonale: Secondary | ICD-10-CM

## 2020-05-25 ENCOUNTER — Other Ambulatory Visit: Payer: Self-pay | Admitting: Sports Medicine

## 2020-05-25 DIAGNOSIS — Z1231 Encounter for screening mammogram for malignant neoplasm of breast: Secondary | ICD-10-CM

## 2020-07-05 ENCOUNTER — Encounter: Payer: Self-pay | Admitting: *Deleted

## 2020-07-13 ENCOUNTER — Ambulatory Visit: Payer: No Typology Code available for payment source

## 2020-07-14 ENCOUNTER — Ambulatory Visit: Payer: No Typology Code available for payment source

## 2020-07-14 ENCOUNTER — Encounter: Payer: No Typology Code available for payment source | Admitting: Obstetrics and Gynecology

## 2020-07-27 ENCOUNTER — Ambulatory Visit: Payer: No Typology Code available for payment source

## 2020-07-27 ENCOUNTER — Encounter: Payer: No Typology Code available for payment source | Admitting: Certified Nurse Midwife

## 2020-08-03 ENCOUNTER — Other Ambulatory Visit: Payer: Self-pay

## 2020-08-03 ENCOUNTER — Encounter: Payer: Self-pay | Admitting: Sports Medicine

## 2020-08-03 ENCOUNTER — Ambulatory Visit: Payer: No Typology Code available for payment source | Admitting: Sports Medicine

## 2020-08-03 VITALS — BP 115/74 | HR 70 | Resp 20 | Ht 68.0 in | Wt 170.0 lb

## 2020-08-03 DIAGNOSIS — I2609 Other pulmonary embolism with acute cor pulmonale: Secondary | ICD-10-CM

## 2020-08-03 DIAGNOSIS — Z23 Encounter for immunization: Secondary | ICD-10-CM

## 2020-08-03 DIAGNOSIS — M170 Bilateral primary osteoarthritis of knee: Secondary | ICD-10-CM | POA: Diagnosis not present

## 2020-08-03 DIAGNOSIS — Z0000 Encounter for general adult medical examination without abnormal findings: Secondary | ICD-10-CM

## 2020-08-03 DIAGNOSIS — E782 Mixed hyperlipidemia: Secondary | ICD-10-CM

## 2020-08-03 NOTE — Progress Notes (Addendum)
Subjective:    CC: Annual Physical Exam  HPI:  This patient is here for their annual physical  I reviewed the past medical history, family history, social history, surgical history, and allergies today and no changes were needed.  Please see the problem list section below in epic for further details.  Past Medical History: Past Medical History:  Diagnosis Date   Anxiety    Arthritis    Chronic pulmonary embolism (HCC)    Past Surgical History: Past Surgical History:  Procedure Laterality Date   APPENDECTOMY     AUGMENTATION MAMMAPLASTY     CESAREAN SECTION     KNEE ARTHROSCOPY Bilateral 03/18/2020   Procedure: Left Knee partial medial menisectomy. Medial and Patella femoral chondroplasty. Medial plica. Right knee partial lateral medial menisectomy. Medial patella and femoral chondroplasty, medial plica excison.;  Surgeon: Jodi Geralds, MD;  Location: [facility];  Service: Orthopedics;  Laterality: Bilateral;   PLACEMENT OF BREAST IMPLANTS     TONSILLECTOMY     Social History: Social History   Socioeconomic History   Marital status: Married    Spouse name: Not on file   Number of children: Not on file   Years of education: Not on file   Highest education level: Not on file  Occupational History   Not on file  Tobacco Use   Smoking status: Former    Pack years: 0.00   Smokeless tobacco: Never   Tobacco comments:    auit 27 years ago   Vaping Use   Vaping Use: Never used  Substance and Sexual Activity   Alcohol use: Yes    Alcohol/week: 0.0 standard drinks    Comment: occas    Drug use: Never   Sexual activity: Yes    Birth control/protection: Post-menopausal  Other Topics Concern   Not on file  Social History Narrative   Not on file   Social Determinants of Health   Financial Resource Strain: Not on file  Food Insecurity: Not on file  Transportation Needs: Not on file  Physical Activity: Not on file  Stress: Not on file  Social Connections: Not on file    Family History: Family History  Problem Relation Age of Onset   Breast cancer Maternal Grandfather    Colon cancer Father    Breast cancer Mother    Stomach cancer Brother    Allergies: No Known Allergies Medications: See med rec.  Review of Systems: No headache, visual changes, nausea, vomiting, diarrhea, constipation, dizziness, abdominal pain, skin rash, fevers, chills, night sweats, swollen lymph nodes, weight loss, chest pain, body aches, joint swelling, muscle aches, shortness of breath, mood changes, visual or auditory hallucinations.  Objective:    General: Well Developed, well nourished, and in no acute distress.  Neuro: Alert and oriented x3, extra-ocular muscles intact, sensation grossly intact. Cranial nerves II through XII are intact, motor, sensory, and coordinative functions are all intact. HEENT: Normocephalic, atraumatic, pupils equal round reactive to light, neck supple, no masses, no lymphadenopathy, thyroid nonpalpable. Oropharynx, nasopharynx, external ear canals are unremarkable. Skin: Warm and dry, no rashes noted.  Cardiac: Regular rate and rhythm, no murmurs rubs or gallops.  Respiratory: Clear to auscultation bilaterally. Not using accessory muscles, speaking in full sentences.  Abdominal: Soft, nontender, nondistended, positive bowel sounds, no masses, no organomegaly.  Musculoskeletal: Shoulder, elbow, wrist, hip, knee, ankle stable, and with full range of motion.  Mild effusion right knee.  Impression and Recommendations:    The patient was counselled, risk factors were  discussed, anticipatory guidance given.  Annual physical exam Johnye is a pleasant 60 year old female here for her annual physical, she is up-to-date on cervical cancer screening, this will be scanned in, she will be getting Tdap and Shingrix today, return in 2 to 6 months for Shingrix #2. Ordering routine labs.   Primary osteoarthritis of both knees Brittanyann has had her bilateral knee  arthroscopy, unfortunately continues to have pain in the right knee, she has had steroid injections and viscosupplementation since her arthroscopy with persistent discomfort. At this point she is a candidate for arthroplasty, I would like her to consider genicular radiofrequency ablation prior. She will look into this and let me know if she would like a referral.  Pulmonary embolism (HCC) History of massive pulmonary embolism with cor pulmonale, resolved on echo, she will be on lifelong Eliquis, I think she can hold off on follow-up with her hematologist and I am happy to take over her Eliquis prescriptions.  Mixed hyperlipidemia Low-cholesterol diet per patient request for the next 3 months with a recheck, we will start a statin if insufficiently improved.  ___________________________________________ Ihor Austin. Benjamin Stain, M.D., ABFM., CAQSM. Primary Care and Sports Medicine [facility]  Adjunct Professor of Family Medicine  [employer]

## 2020-08-03 NOTE — Assessment & Plan Note (Signed)
Vanessa Snyder is a pleasant 60 year old female here for her annual physical, she is up-to-date on cervical cancer screening, this will be scanned in, she will be getting Tdap and Shingrix today, return in 2 to 6 months for Shingrix #2. Ordering routine labs.

## 2020-08-03 NOTE — Assessment & Plan Note (Signed)
History of massive pulmonary embolism with cor pulmonale, resolved on echo, she will be on lifelong Eliquis, I think she can hold off on follow-up with her hematologist and I am happy to take over her Eliquis prescriptions.

## 2020-08-03 NOTE — Assessment & Plan Note (Signed)
Vanessa Snyder has had her bilateral knee arthroscopy, unfortunately continues to have pain in the right knee, she has had steroid injections and viscosupplementation since her arthroscopy with persistent discomfort. At this point she is a candidate for arthroplasty, I would like her to consider genicular radiofrequency ablation prior. She will look into this and let me know if she would like a referral.

## 2020-08-03 NOTE — Addendum Note (Signed)
Addended by: Gonzella Lex R on: 08/03/2020 12:01 PM   Modules accepted: Orders

## 2020-08-04 ENCOUNTER — Other Ambulatory Visit (HOSPITAL_COMMUNITY)
Admission: RE | Admit: 2020-08-04 | Discharge: 2020-08-04 | Disposition: A | Discharge status: Home / Self Care | Payer: No Typology Code available for payment source | Source: Ambulatory Visit | Attending: Obstetrics and Gynecology | Admitting: Obstetrics and Gynecology

## 2020-08-04 ENCOUNTER — Ambulatory Visit (INDEPENDENT_AMBULATORY_CARE_PROVIDER_SITE_OTHER): Payer: No Typology Code available for payment source | Admitting: Obstetrics and Gynecology

## 2020-08-04 ENCOUNTER — Ambulatory Visit (INDEPENDENT_AMBULATORY_CARE_PROVIDER_SITE_OTHER): Payer: No Typology Code available for payment source

## 2020-08-04 ENCOUNTER — Encounter: Payer: Self-pay | Admitting: Obstetrics and Gynecology

## 2020-08-04 VITALS — BP 97/68 | HR 67 | Resp 16 | Ht 68.0 in | Wt 171.0 lb

## 2020-08-04 DIAGNOSIS — Z01411 Encounter for gynecological examination (general) (routine) with abnormal findings: Secondary | ICD-10-CM

## 2020-08-04 DIAGNOSIS — Z01419 Encounter for gynecological examination (general) (routine) without abnormal findings: Secondary | ICD-10-CM | POA: Diagnosis not present

## 2020-08-04 DIAGNOSIS — Z1231 Encounter for screening mammogram for malignant neoplasm of breast: Secondary | ICD-10-CM

## 2020-08-04 LAB — COMPREHENSIVE METABOLIC PANEL
AG Ratio: 1.7 (calc) (ref 1.0–2.5)
ALT: 23 U/L (ref 6–29)
AST: 22 U/L (ref 10–35)
Albumin: 4.4 g/dL (ref 3.6–5.1)
Alkaline phosphatase (APISO): 64 U/L (ref 37–153)
BUN: 10 mg/dL (ref 7–25)
CO2: 27 mmol/L (ref 20–32)
Calcium: 9.9 mg/dL (ref 8.6–10.4)
Chloride: 102 mmol/L (ref 98–110)
Creat: 0.68 mg/dL (ref 0.50–0.99)
Globulin: 2.6 g/dL (calc) (ref 1.9–3.7)
Glucose, Bld: 97 mg/dL (ref 65–99)
Potassium: 4.2 mmol/L (ref 3.5–5.3)
Sodium: 139 mmol/L (ref 135–146)
Total Bilirubin: 0.8 mg/dL (ref 0.2–1.2)
Total Protein: 7 g/dL (ref 6.1–8.1)

## 2020-08-04 LAB — CBC
HCT: 41.7 % (ref 35.0–45.0)
Hemoglobin: 14 g/dL (ref 11.7–15.5)
MCH: 32 pg (ref 27.0–33.0)
MCHC: 33.6 g/dL (ref 32.0–36.0)
MCV: 95.4 fL (ref 80.0–100.0)
MPV: 10.3 fL (ref 7.5–12.5)
Platelets: 303 10*3/uL (ref 140–400)
RBC: 4.37 10*6/uL (ref 3.80–5.10)
RDW: 12.6 % (ref 11.0–15.0)
WBC: 5.9 10*3/uL (ref 3.8–10.8)

## 2020-08-04 LAB — LIPID PANEL
Cholesterol: 252 mg/dL — ABNORMAL HIGH (ref <200)
HDL: 94 mg/dL (ref >=50)
LDL Cholesterol (Calc): 139 mg/dL (calc) — ABNORMAL HIGH
Non-HDL Cholesterol (Calc): 158 mg/dL (calc) — ABNORMAL HIGH (ref <130)
Total CHOL/HDL Ratio: 2.7 (calc) (ref <5.0)
Triglycerides: 93 mg/dL (ref <150)

## 2020-08-04 LAB — TSH: TSH: 1.04 mIU/L (ref 0.40–4.50)

## 2020-08-04 NOTE — Progress Notes (Signed)
Subjective:     Vanessa Snyder is a 60 y.o. female postmenopausal with BMI 26 who is here for a comprehensive physical exam. The patient reports no problems. She denies any episodes of postmenopausal vaginal bleeding. She denies any pelvic pain or abnormal discharge. Patient is sexually active without dyspareunia or other complaints. Patient denies any urinary incontinence. Patient is without any complaints.   Past Medical History:  Diagnosis Date   Anxiety    Arthritis    Chronic pulmonary embolism (HCC)    Past Surgical History:  Procedure Laterality Date   APPENDECTOMY     AUGMENTATION MAMMAPLASTY     CESAREAN SECTION     KNEE ARTHROSCOPY Bilateral 03/18/2020   Procedure: Left Knee partial medial menisectomy. Medial and Patella femoral chondroplasty. Medial plica. Right knee partial lateral medial menisectomy. Medial patella and femoral chondroplasty, medial plica excison.;  Surgeon: Jodi Geralds, MD;  Location: [facility];  Service: Orthopedics;  Laterality: Bilateral;   PLACEMENT OF BREAST IMPLANTS     TONSILLECTOMY     No family history on file.   Social History   Socioeconomic History   Marital status: Married    Spouse name: Not on file   Number of children: Not on file   Years of education: Not on file   Highest education level: Not on file  Occupational History   Not on file  Tobacco Use   Smoking status: Former    Pack years: 0.00   Smokeless tobacco: Never   Tobacco comments:    auit 27 years ago   Vaping Use   Vaping Use: Never used  Substance and Sexual Activity   Alcohol use: Yes    Alcohol/week: 0.0 standard drinks    Comment: occas    Drug use: Never   Sexual activity: Not on file  Other Topics Concern   Not on file  Social History Narrative   Not on file   Social Determinants of Health   Financial Resource Strain: Not on file  Food Insecurity: Not on file  Transportation Needs: Not on file  Physical Activity: Not on file  Stress: Not on file   Social Connections: Not on file  Intimate Partner Violence: Not on file   Health Maintenance  Topic Date Due   PAP SMEAR-Modifier  03/16/2018   COVID-19 Vaccine (3 - Booster for Moderna series) 04/15/2020   Zoster Vaccines- Shingrix (2 of 2) 09/28/2020   MAMMOGRAM  07/08/2021   COLONOSCOPY (Pts 45-66yrs Insurance coverage will need to be confirmed)  03/16/2029   TETANUS/TDAP  08/04/2030   Hepatitis C Screening  Completed   HIV Screening  Completed   Pneumococcal Vaccine 64-92 Years old  Aged Out   HPV VACCINES  Aged Out       Review of Systems Pertinent items noted in HPI and remainder of comprehensive ROS otherwise negative.   Objective:  Blood pressure 97/68, pulse 67, resp. rate 16, height 5\' 8"  (1.727 m), weight 171 lb (77.6 kg).    GENERAL: Well-developed, well-nourished female in no acute distress.  HEENT: Normocephalic, atraumatic. Sclerae anicteric.  NECK: Supple. Normal thyroid.  LUNGS: Clear to auscultation bilaterally.  HEART: Regular rate and rhythm. BREASTS: Symmetric in size. No palpable masses or lymphadenopathy, skin changes, or nipple drainage. ABDOMEN: Soft, nontender, nondistended. No organomegaly. PELVIC: Normal external female genitalia. Vagina is pink and rugated.  Normal discharge. Normal appearing cervix. Uterus is normal in size.  No adnexal mass or tenderness. EXTREMITIES: No cyanosis, clubbing, or edema, 2+ distal  pulses.    Assessment:    Healthy female exam.      Plan:    Pap smear collected Screening mammogram done this morning Patient up to date with colonoscopy Patient will be contacted with abnormal results See After Visit Summary for Counseling Recommendations

## 2020-08-05 DIAGNOSIS — E782 Mixed hyperlipidemia: Secondary | ICD-10-CM | POA: Insufficient documentation

## 2020-08-05 NOTE — Assessment & Plan Note (Addendum)
Low-cholesterol diet per patient request for the next 3 months with a recheck, we will start a statin if insufficiently improved.

## 2020-08-11 LAB — CYTOLOGY - PAP
Comment: NEGATIVE
Diagnosis: NEGATIVE
High risk HPV: NEGATIVE

## 2020-10-17 ENCOUNTER — Ambulatory Visit (INDEPENDENT_AMBULATORY_CARE_PROVIDER_SITE_OTHER): Payer: No Typology Code available for payment source | Admitting: Sports Medicine

## 2020-10-17 ENCOUNTER — Other Ambulatory Visit: Payer: Self-pay

## 2020-10-17 VITALS — BP 109/65 | HR 60 | Temp 98.7°F

## 2020-10-17 DIAGNOSIS — Z23 Encounter for immunization: Secondary | ICD-10-CM | POA: Diagnosis not present

## 2020-10-17 NOTE — Progress Notes (Signed)
Pt here for Shingrix vaccine #2.  Pt was offered influenza vaccine, which she declined.  Screening questionnaire reviewed, VIS provided to patient, and any/all patient questions answered.  Tiajuana Amass, CMA

## 2021-03-21 ENCOUNTER — Other Ambulatory Visit: Payer: Self-pay | Admitting: Sports Medicine

## 2021-03-21 DIAGNOSIS — M5136 Other intervertebral disc degeneration, lumbar region: Secondary | ICD-10-CM

## 2021-03-21 DIAGNOSIS — I2609 Other pulmonary embolism with acute cor pulmonale: Secondary | ICD-10-CM

## 2021-03-21 DIAGNOSIS — F39 Unspecified mood [affective] disorder: Secondary | ICD-10-CM

## 2021-04-19 ENCOUNTER — Telehealth: Payer: Self-pay | Admitting: Sports Medicine

## 2021-04-19 DIAGNOSIS — E782 Mixed hyperlipidemia: Secondary | ICD-10-CM

## 2021-04-19 NOTE — Telephone Encounter (Signed)
Yes it is reasonable to go ahead and do fasting labs and check them all and that would get them done for this year.  I will order them now. ?

## 2021-04-19 NOTE — Telephone Encounter (Signed)
Patient aware to come to the appt fasting and we will do her labs at the same appt.  ?

## 2021-04-19 NOTE — Telephone Encounter (Signed)
Pt has a visit coming up on 4/13 for knee injections and she wants to know if she is due for bloodwork for her meds. She wants to know if this can all be addressed at this appt.  ?

## 2021-05-11 ENCOUNTER — Ambulatory Visit (INDEPENDENT_AMBULATORY_CARE_PROVIDER_SITE_OTHER): Payer: No Typology Code available for payment source

## 2021-05-11 ENCOUNTER — Ambulatory Visit: Payer: No Typology Code available for payment source | Admitting: Sports Medicine

## 2021-05-11 DIAGNOSIS — M542 Cervicalgia: Secondary | ICD-10-CM | POA: Diagnosis not present

## 2021-05-11 DIAGNOSIS — M47812 Spondylosis without myelopathy or radiculopathy, cervical region: Secondary | ICD-10-CM | POA: Insufficient documentation

## 2021-05-11 DIAGNOSIS — M180 Bilateral primary osteoarthritis of first carpometacarpal joints: Secondary | ICD-10-CM | POA: Diagnosis not present

## 2021-05-11 DIAGNOSIS — M170 Bilateral primary osteoarthritis of knee: Secondary | ICD-10-CM

## 2021-05-11 DIAGNOSIS — M722 Plantar fascial fibromatosis: Secondary | ICD-10-CM | POA: Diagnosis not present

## 2021-05-11 MED ORDER — ACETAMINOPHEN ER 650 MG PO TBCR: 650.0000 mg | EXTENDED_RELEASE_TABLET | Freq: Three times a day (TID) | ORAL | 3 refills | Status: DC | PRN

## 2021-05-11 NOTE — Assessment & Plan Note (Signed)
Bilateral plantar foot pain, except to the ankle. ?She did well after an injection back in June 2021. ?For now she will do arthritis from Tylenol, she will also go to the good foot store and get some highly supportive noncustom orthotics as well as tennis shoes for ambulation. ?Return to see me in a month for this. ?

## 2021-05-11 NOTE — Assessment & Plan Note (Addendum)
Bilateral first CMC osteoarthritis, bilateral hand x-rays, arthritis from Tylenol, home conditioning given. ?Return to see me a month, injection if not better. ?We can add a thumb loop if not better. ?

## 2021-05-11 NOTE — Patient Instructions (Signed)
Get a thumb loop off of [employer] ?

## 2021-05-11 NOTE — Assessment & Plan Note (Signed)
Bilateral knee injection as above, she is post steroid injections, viscosupplementation and since her arthroscopy with persistent discomfort. ?

## 2021-05-11 NOTE — Progress Notes (Signed)
? ? ?  Procedures performed today:   ? ?Procedure: Real-time Ultrasound Guided injection of the left knee ?Device: Samsung HS60  ?Verbal informed consent obtained.  ?Time-out conducted.  ?Noted no overlying erythema, induration, or other signs of local infection.  ?Skin prepped in a sterile fashion.  ?Local anesthesia: Topical Ethyl chloride.  ?With sterile technique and under real time ultrasound guidance: Noted trace effusion, 1 cc Kenalog 40, 2 cc lidocaine, 2 cc bupivacaine injected easily ?Completed without difficulty  ?Advised to call if fevers/chills, erythema, induration, drainage, or persistent bleeding.  ?Images permanently stored and available for review in PACS.  ?Impression: Technically successful ultrasound guided injection. ? ?Procedure: Real-time Ultrasound Guided injection of the right knee ?Device: Samsung HS60  ?Verbal informed consent obtained.  ?Time-out conducted.  ?Noted no overlying erythema, induration, or other signs of local infection.  ?Skin prepped in a sterile fashion.  ?Local anesthesia: Topical Ethyl chloride.  ?With sterile technique and under real time ultrasound guidance: Noted trace effusion, 1 cc Kenalog 40, 2 cc lidocaine, 2 cc bupivacaine injected easily ?Completed without difficulty  ?Advised to call if fevers/chills, erythema, induration, drainage, or persistent bleeding.  ?Images permanently stored and available for review in PACS.  ?Impression: Technically successful ultrasound guided injection. ? ?Independent interpretation of notes and tests performed by another provider:  ? ?None. ? ?Brief History, Exam, Impression, and Recommendations:   ? ?Primary osteoarthritis of both knees ?Bilateral knee injection as above, she is post steroid injections, viscosupplementation and since her arthroscopy with persistent discomfort. ? ?Primary osteoarthritis of both first carpometacarpal joints ?Bilateral first CMC osteoarthritis, bilateral hand x-rays, arthritis from Tylenol, home  conditioning given. ?Return to see me a month, injection if not better. ?We can add a thumb loop if not better. ? ?Plantar fasciitis, left ?Bilateral plantar foot pain, except to the ankle. ?She did well after an injection back in June 2021. ?For now she will do arthritis from Tylenol, she will also go to the good foot store and get some highly supportive noncustom orthotics as well as tennis shoes for ambulation. ?Return to see me in a month for this. ? ?Cervical spondylosis ?Chronic axial neck pain, adding neck x-rays, Tylenol, home conditioning. ? ? ? ? ?___________________________________________ ?Vanessa Snyder. Benjamin Stain, M.D., ABFM., CAQSM. ?Primary Care and Sports Medicine ?[facility] ? ?Adjunct Instructor of Family Medicine  ?[employer] ?

## 2021-05-11 NOTE — Assessment & Plan Note (Signed)
Chronic axial neck pain, adding neck x-rays, Tylenol, home conditioning. ? ?

## 2021-05-12 LAB — COMPREHENSIVE METABOLIC PANEL
AG Ratio: 1.8 (calc) (ref 1.0–2.5)
ALT: 19 U/L (ref 6–29)
AST: 24 U/L (ref 10–35)
Albumin: 4.5 g/dL (ref 3.6–5.1)
Alkaline phosphatase (APISO): 77 U/L (ref 37–153)
BUN: 16 mg/dL (ref 7–25)
CO2: 28 mmol/L (ref 20–32)
Calcium: 10.1 mg/dL (ref 8.6–10.4)
Chloride: 102 mmol/L (ref 98–110)
Creat: 0.65 mg/dL (ref 0.50–1.05)
Globulin: 2.5 g/dL (calc) (ref 1.9–3.7)
Glucose, Bld: 80 mg/dL (ref 65–99)
Potassium: 4.6 mmol/L (ref 3.5–5.3)
Sodium: 138 mmol/L (ref 135–146)
Total Bilirubin: 0.8 mg/dL (ref 0.2–1.2)
Total Protein: 7 g/dL (ref 6.1–8.1)

## 2021-05-12 LAB — CBC
HCT: 40.2 % (ref 35.0–45.0)
Hemoglobin: 13.2 g/dL (ref 11.7–15.5)
MCH: 31.8 pg (ref 27.0–33.0)
MCHC: 32.8 g/dL (ref 32.0–36.0)
MCV: 96.9 fL (ref 80.0–100.0)
MPV: 10.3 fL (ref 7.5–12.5)
Platelets: 306 10*3/uL (ref 140–400)
RBC: 4.15 10*6/uL (ref 3.80–5.10)
RDW: 12.6 % (ref 11.0–15.0)
WBC: 4.4 10*3/uL (ref 3.8–10.8)

## 2021-05-12 LAB — LIPID PANEL
Cholesterol: 221 mg/dL — ABNORMAL HIGH (ref <200)
HDL: 98 mg/dL (ref >=50)
LDL Cholesterol (Calc): 108 mg/dL (calc) — ABNORMAL HIGH
Non-HDL Cholesterol (Calc): 123 mg/dL (calc) (ref <130)
Total CHOL/HDL Ratio: 2.3 (calc) (ref <5.0)
Triglycerides: 66 mg/dL (ref <150)

## 2021-05-12 LAB — HEMOGLOBIN A1C
Hgb A1c MFr Bld: 5.4 % of total Hgb (ref <5.7)
Mean Plasma Glucose: 108 mg/dL
eAG (mmol/L): 6 mmol/L

## 2021-05-12 LAB — TSH: TSH: 0.95 mIU/L (ref 0.40–4.50)

## 2021-05-24 ENCOUNTER — Other Ambulatory Visit: Payer: Self-pay | Admitting: Sports Medicine

## 2021-05-24 DIAGNOSIS — Z1231 Encounter for screening mammogram for malignant neoplasm of breast: Secondary | ICD-10-CM

## 2021-06-08 ENCOUNTER — Ambulatory Visit: Payer: No Typology Code available for payment source | Admitting: Sports Medicine

## 2021-08-10 ENCOUNTER — Ambulatory Visit: Payer: No Typology Code available for payment source | Admitting: Obstetrics and Gynecology

## 2021-08-10 ENCOUNTER — Ambulatory Visit: Payer: No Typology Code available for payment source

## 2021-08-13 NOTE — Progress Notes (Signed)
   ANNUAL EXAM Patient name: Vanessa Snyder MRN [id number]  Date of birth: 1961-01-15 Chief Complaint:   Annual Exam  History of Present Illness:   Vanessa Snyder is a 61 y.o. [phone] female being seen today for a routine annual exam.   Current complaints: None.  No LMP recorded. Patient is postmenopausal.   Last pap 07/2020. Results were: NILM w/ HRHPV negative. H/O abnormal pap: no MXR scheduled for 08/17/21 after this appointment.   Review of Systems:   Pertinent items are noted in HPI Denies any headaches, blurred vision, fatigue, shortness of breath, chest pain, abdominal pain, abnormal vaginal discharge/itching/odor/irritation, problems with periods, bowel movements, urination, or intercourse unless otherwise stated above.  Pertinent History Reviewed:  Reviewed past medical,surgical, social and family history.  Reviewed problem list, medications and allergies. Physical Assessment:   Vitals:   08/17/21 0940  BP: 126/82  Pulse: (!) 58  Weight: 178 lb (80.7 kg)  Height: 5\' 8"  (1.727 m)  Body mass index is 27.06 kg/m.   Physical Examination:  General appearance - well appearing, and in no distress Mental status - alert, oriented to person, place, and time Psych:  She has a normal mood and affect Skin - warm and dry, normal color, no suspicious lesions noted Chest - effort normal Heart - normal rate  Breasts - breasts appear normal, no suspicious masses, no skin or nipple changes or axillary nodes Abdomen - soft, nontender, nondistended, no masses or organomegaly Pelvic -  VULVA: normal appearing vulva with no masses, tenderness or lesions  VAGINA: normal appearing vagina with atrophic color and discharge, no lesions   CERVIX: normal appearing cervix without discharge or lesions, no CMT UTERUS: uterus is felt to be normal size, shape, consistency and nontender  ADNEXA: No adnexal masses or tenderness noted. Extremities:  No swelling or varicosities noted  Chaperone  present for exam  No results found for this or any previous visit (from the past 24 hour(s)).  Assessment & Plan:  Vanessa Snyder was seen today for annual exam.  Diagnoses and all orders for this visit:  Encounter for annual routine gynecological examination  - Cervical cancer screening: Discussed guidelines. Pap with HPV wnl 07/2020 - Breast Health: Encouraged self breast awareness/SBE. Discussed limits of clinical breast exam for detecting breast cancer. Discussed importance of annual MXR.  MXR scheduled for today - Climacteric/Sexual health: Reviewed typical and atypical symptoms of menopause/peri-menopause. Discussed PMB and to call if any amount of spotting.  - Colonoscopy: up to date - F/U 12 months and prn   No orders of the defined types were placed in this encounter.   Meds: No orders of the defined types were placed in this encounter.   Follow-up: Return in about 1 year (around 08/18/2022) for annual.  08/20/2022, MD 08/17/2021 10:10 AM

## 2021-08-17 ENCOUNTER — Ambulatory Visit (INDEPENDENT_AMBULATORY_CARE_PROVIDER_SITE_OTHER): Payer: 59 | Admitting: Obstetrics and Gynecology

## 2021-08-17 ENCOUNTER — Ambulatory Visit (INDEPENDENT_AMBULATORY_CARE_PROVIDER_SITE_OTHER): Payer: 59

## 2021-08-17 ENCOUNTER — Encounter: Payer: Self-pay | Admitting: Obstetrics and Gynecology

## 2021-08-17 VITALS — BP 126/82 | HR 58 | Ht 68.0 in | Wt 178.0 lb

## 2021-08-17 DIAGNOSIS — Z1231 Encounter for screening mammogram for malignant neoplasm of breast: Secondary | ICD-10-CM | POA: Diagnosis not present

## 2021-08-17 DIAGNOSIS — Z01419 Encounter for gynecological examination (general) (routine) without abnormal findings: Secondary | ICD-10-CM | POA: Diagnosis not present

## 2021-09-12 ENCOUNTER — Ambulatory Visit (INDEPENDENT_AMBULATORY_CARE_PROVIDER_SITE_OTHER): Payer: 59

## 2021-09-12 ENCOUNTER — Ambulatory Visit: Payer: 59 | Admitting: Sports Medicine

## 2021-09-12 DIAGNOSIS — M170 Bilateral primary osteoarthritis of knee: Secondary | ICD-10-CM

## 2021-09-12 DIAGNOSIS — E663 Overweight: Secondary | ICD-10-CM

## 2021-09-12 DIAGNOSIS — F39 Unspecified mood [affective] disorder: Secondary | ICD-10-CM | POA: Diagnosis not present

## 2021-09-12 MED ORDER — SERTRALINE HCL 50 MG PO TABS: 50.0000 mg | ORAL_TABLET | Freq: Every day | ORAL | 3 refills | Status: DC

## 2021-09-12 NOTE — Progress Notes (Signed)
    Procedures performed today:    Procedure: Real-time Ultrasound Guided injection of the left knee Device: Samsung HS60  Verbal informed consent obtained.  Time-out conducted.  Noted no overlying erythema, induration, or other signs of local infection.  Skin prepped in a sterile fashion.  Local anesthesia: Topical Ethyl chloride.  With sterile technique and under real time ultrasound guidance: Trace effusion noted, 1 cc Kenalog 40, 2 cc lidocaine, 2 cc bupivacaine injected easily Completed without difficulty  Advised to call if fevers/chills, erythema, induration, drainage, or persistent bleeding.  Images permanently stored and available for review in PACS.  Impression: Technically successful ultrasound guided injection.  Procedure: Real-time Ultrasound Guided injection of the right knee Device: Samsung HS60  Verbal informed consent obtained.  Time-out conducted.  Noted no overlying erythema, induration, or other signs of local infection.  Skin prepped in a sterile fashion.  Local anesthesia: Topical Ethyl chloride.  With sterile technique and under real time ultrasound guidance: Trace effusion noted, 1 cc Kenalog 40, 2 cc lidocaine, 2 cc bupivacaine injected easily Completed without difficulty  Advised to call if fevers/chills, erythema, induration, drainage, or persistent bleeding.  Images permanently stored and available for review in PACS.  Impression: Technically successful ultrasound guided injection.  Independent interpretation of notes and tests performed by another provider:   None.  Brief History, Exam, Impression, and Recommendations:    Primary osteoarthritis of both knees Very pleasant 61 year old female, she has had some steroid injections several months ago, now having a recurrence of pain. Bilateral steroid injections today. Of note she has had viscosupplementation.   Mood disorder (HCC) Currently having some family strife, increasing Zoloft to 50 mg  daily.  Overweight Needs to lose about 10 pounds to get BMI in the normal range, she will work on diet and exercise over the next 3 months, and if she does not hit the target we can consider phentermine.    ____________________________________________ Ihor Austin. Benjamin Stain, M.D., ABFM., CAQSM., AME. Primary Care and Sports Medicine [facility]  Adjunct Professor of Family Medicine  [employer]  Restaurant manager, fast food

## 2021-09-12 NOTE — Assessment & Plan Note (Signed)
Very pleasant 61 year old female, she has had some steroid injections several months ago, now having a recurrence of pain. Bilateral steroid injections today. Of note she has had viscosupplementation.

## 2021-09-12 NOTE — Assessment & Plan Note (Signed)
Needs to lose about 10 pounds to get BMI in the normal range, she will work on diet and exercise over the next 3 months, and if she does not hit the target we can consider phentermine.

## 2021-09-12 NOTE — Assessment & Plan Note (Signed)
Currently having some family strife, increasing Zoloft to 50 mg daily.

## 2021-09-27 ENCOUNTER — Telehealth: Payer: Self-pay

## 2021-09-27 DIAGNOSIS — F39 Unspecified mood [affective] disorder: Secondary | ICD-10-CM

## 2021-09-27 MED ORDER — SERTRALINE HCL 50 MG PO TABS: 50.0000 mg | ORAL_TABLET | Freq: Every day | ORAL | 3 refills | Status: DC

## 2021-09-27 NOTE — Telephone Encounter (Signed)
Pt called stating that the sertraline prescription sent on 09/12/21 was sent to incorrect pharmacy.  She needs this medication sent to [facility].  RX resent to correct pharmacy.  Tiajuana Amass, CMA

## 2021-09-29 ENCOUNTER — Telehealth: Payer: Self-pay

## 2021-09-29 DIAGNOSIS — F39 Unspecified mood [affective] disorder: Secondary | ICD-10-CM

## 2021-09-29 MED ORDER — BUPROPION HCL ER (XL) 150 MG PO TB24: 150.0000 mg | ORAL_TABLET | Freq: Every day | ORAL | 11 refills | Status: DC

## 2021-09-29 NOTE — Telephone Encounter (Signed)
Attempted to call patient to discuss; phone would not connect (no ringing or busy signal).

## 2021-09-29 NOTE — Telephone Encounter (Signed)
Will do, its only been about 3 weeks since we increased the Zoloft so I would not expect her to be having full efficacy, that being said there are some increased depressive symptoms so continue Zoloft 50 and adding Wellbutrin 150 sent to [facility], follow-up should be in about 6 weeks from now, cancel any additional follow-ups before that.

## 2021-09-29 NOTE — Telephone Encounter (Signed)
Patient called to request starting Welbutrin. She stated  that you discussed it at her last OV.

## 2021-10-26 ENCOUNTER — Ambulatory Visit: Payer: 59 | Admitting: Sports Medicine

## 2021-10-26 ENCOUNTER — Ambulatory Visit (INDEPENDENT_AMBULATORY_CARE_PROVIDER_SITE_OTHER): Payer: 59

## 2021-10-26 DIAGNOSIS — M170 Bilateral primary osteoarthritis of knee: Secondary | ICD-10-CM

## 2021-10-26 DIAGNOSIS — F39 Unspecified mood [affective] disorder: Secondary | ICD-10-CM

## 2021-10-26 DIAGNOSIS — M5136 Other intervertebral disc degeneration, lumbar region: Secondary | ICD-10-CM | POA: Diagnosis not present

## 2021-10-26 DIAGNOSIS — Z09 Encounter for follow-up examination after completed treatment for conditions other than malignant neoplasm: Secondary | ICD-10-CM

## 2021-10-26 DIAGNOSIS — M51369 Other intervertebral disc degeneration, lumbar region without mention of lumbar back pain or lower extremity pain: Secondary | ICD-10-CM

## 2021-10-26 MED ORDER — BUPROPION HCL ER (XL) 150 MG PO TB24: 150.0000 mg | ORAL_TABLET | Freq: Every day | ORAL | 1 refills | Status: DC

## 2021-10-26 MED ORDER — SERTRALINE HCL 25 MG PO TABS: 25.0000 mg | ORAL_TABLET | Freq: Every day | ORAL | 1 refills | Status: DC

## 2021-10-26 NOTE — Assessment & Plan Note (Addendum)
Worsening right knee pain, posterior/lateral, moderate mechanical symptoms, we just did an injection about 6 weeks ago without sufficient improvement, she has had viscosupplementation as well without sufficient improvement, she is also had arthroscopic intervention. Considering worsening pain posterior laterally and mechanical symptoms I would like her to proceed with updated x-rays and an MRI.  Update: MRI does show extensive meniscal tearing, she is post arthroscopy on the left, I do think we need another surgical opinion from Dr. Berenice Primas and considering failure of home conditioning, activity modification, medications, injection.

## 2021-10-26 NOTE — Assessment & Plan Note (Signed)
Doing really well with the addition of Wellbutrin, she has dropped Zoloft back down to 25 mg daily, we will continue all of this for 6 months before making any changes.

## 2021-10-26 NOTE — Assessment & Plan Note (Signed)
Vanessa Snyder does have multilevel lumbar degenerative disc disease, she does have right S1 nerve root compression intraspinal. She is having worsening axial back pain, discogenic, with radiation potentially down the buttock, she does have some pain in the back of the right lower leg. I am unsure as to whether the pain in her knee posterior leg is from her knee or from her spine, we will go ahead and have her do some home physical therapy for 4 to 6 weeks followed by a right S1 epidural if insufficient improvement.

## 2021-10-26 NOTE — Progress Notes (Addendum)
    Procedures performed today:    None.  Independent interpretation of notes and tests performed by another provider:   None.  Brief History, Exam, Impression, and Recommendations:    Lumbar degenerative disc disease Vanessa Snyder does have multilevel lumbar degenerative disc disease, she does have right S1 nerve root compression intraspinal. She is having worsening axial back pain, discogenic, with radiation potentially down the buttock, she does have some pain in the back of the right lower leg. I am unsure as to whether the pain in Snyder knee posterior leg is from Snyder knee or from Snyder spine, we will go ahead and have her do some home physical therapy for 4 to 6 weeks followed by a right S1 epidural if insufficient improvement.  Primary osteoarthritis of both knees, degenerative meniscal tearing right knee Worsening right knee pain, posterior/lateral, moderate mechanical symptoms, we just did an injection about 6 weeks ago without sufficient improvement, she has had viscosupplementation as well without sufficient improvement, she is also had arthroscopic intervention. Considering worsening pain posterior laterally and mechanical symptoms I would like Snyder to proceed with updated x-rays and an MRI.  Update: MRI does show extensive meniscal tearing, she is post arthroscopy on the left, I do think we need another surgical opinion from Dr. Berenice Primas and considering failure of home conditioning, activity modification, medications, injection.  Mood disorder ([place]) Doing really well with the addition of Wellbutrin, she has dropped Zoloft back down to 25 mg daily, we will continue all of this for 6 months before making any changes.    ____________________________________________ Vanessa Snyder. Vanessa Snyder, M.D., ABFM., CAQSM., AME. Primary Care and Sports Medicine [facility]  Adjunct Professor of [employer]  Environmental manager

## 2021-10-30 ENCOUNTER — Ambulatory Visit (INDEPENDENT_AMBULATORY_CARE_PROVIDER_SITE_OTHER): Payer: 59

## 2021-10-30 DIAGNOSIS — M170 Bilateral primary osteoarthritis of knee: Secondary | ICD-10-CM

## 2021-11-01 NOTE — Addendum Note (Signed)
Addended by: Silverio Decamp on: 11/01/2021 03:40 PM   Modules accepted: Orders

## 2021-11-23 ENCOUNTER — Ambulatory Visit: Payer: 59 | Admitting: Sports Medicine

## 2022-02-08 ENCOUNTER — Other Ambulatory Visit: Payer: Self-pay | Admitting: Sports Medicine

## 2022-02-08 DIAGNOSIS — F39 Unspecified mood [affective] disorder: Secondary | ICD-10-CM

## 2022-02-08 DIAGNOSIS — M5136 Other intervertebral disc degeneration, lumbar region: Secondary | ICD-10-CM

## 2022-02-08 DIAGNOSIS — I2609 Other pulmonary embolism with acute cor pulmonale: Secondary | ICD-10-CM

## 2022-02-16 IMAGING — US US EXTREM LOW VENOUS*R*
1 series · 13 of 24 positions shown · non-contrast
Comparison: Right lower extremity venous ultrasound 07/08/2018

CLINICAL DATA: Right posterior leg pain for several weeks.

EXAM:
RIGHT LOWER EXTREMITY VENOUS DOPPLER ULTRASOUND
TECHNIQUE: Gray-scale sonography with compression, as well as color and duplex
ultrasound, were performed to evaluate the deep venous system(s)
from the level of the common femoral vein through the popliteal and
proximal calf veins.

[Series 1: us extrem low venous*right* · 0.07mm/px · 13 of 51 slices shown]
[im 1/51]
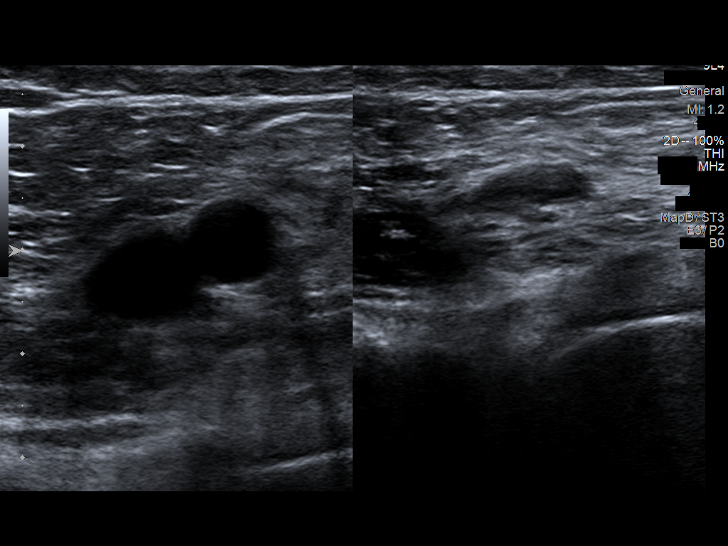
[im 5/51]
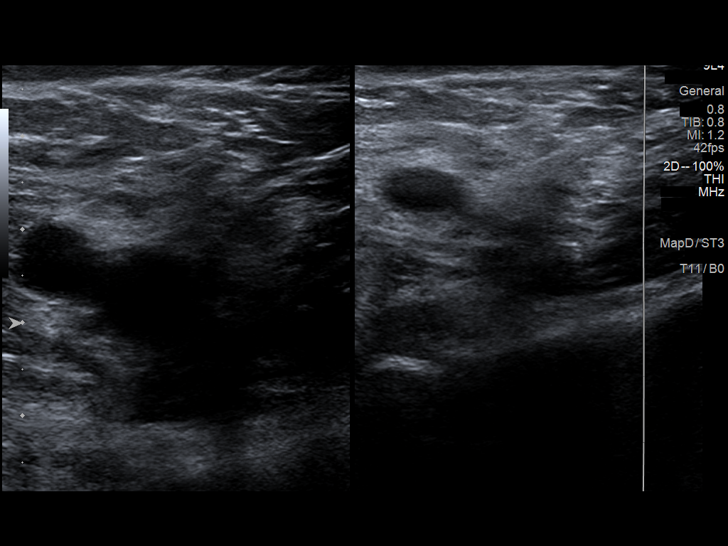
[im 9/51]
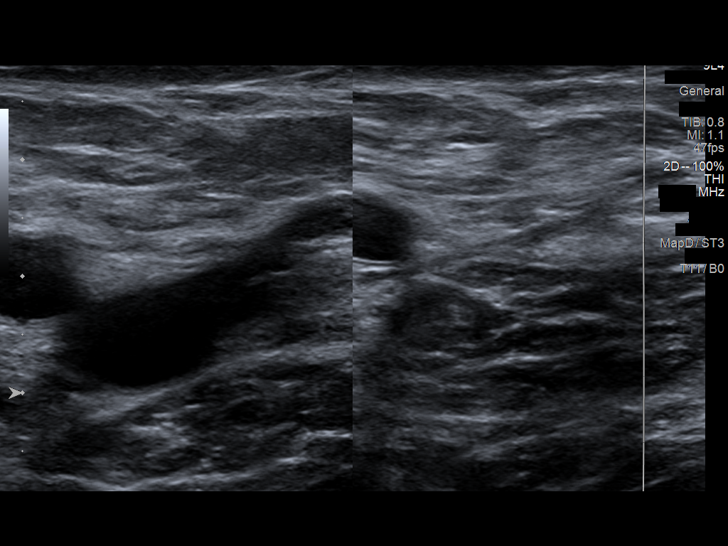
[im 14/51]
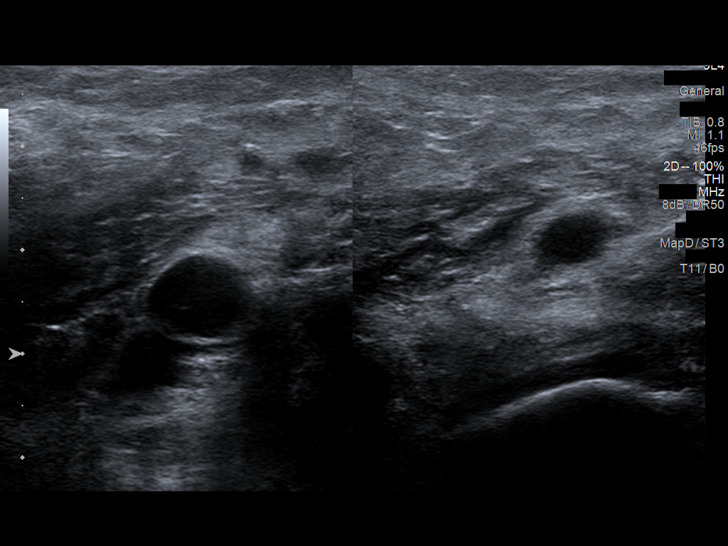
[im 18/51]
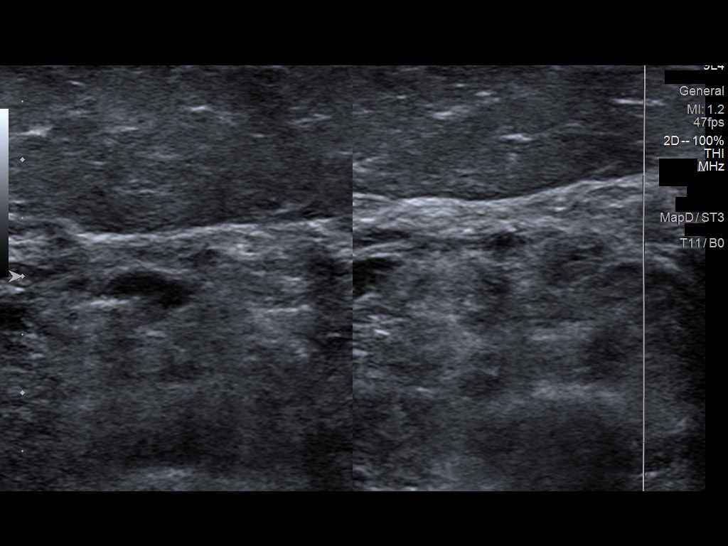
[im 22/51]
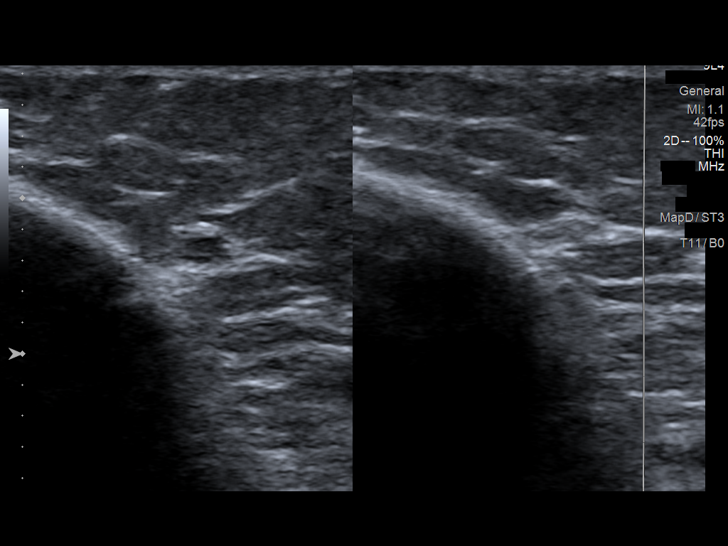
[im 27/51]
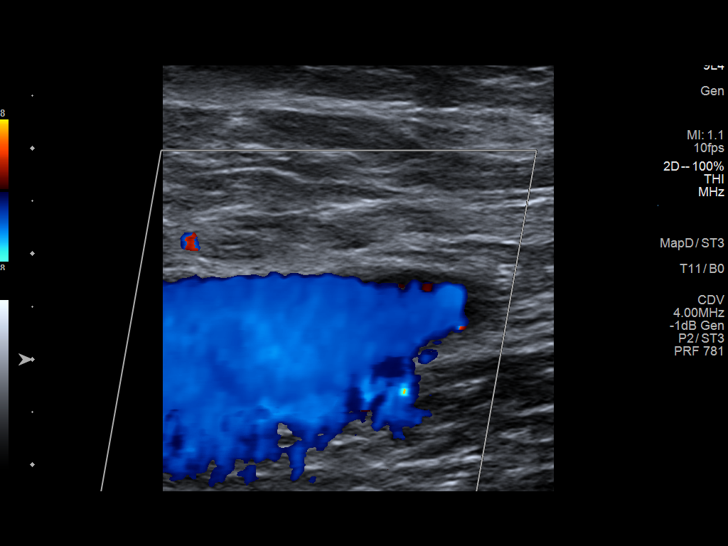
[im 29/51]
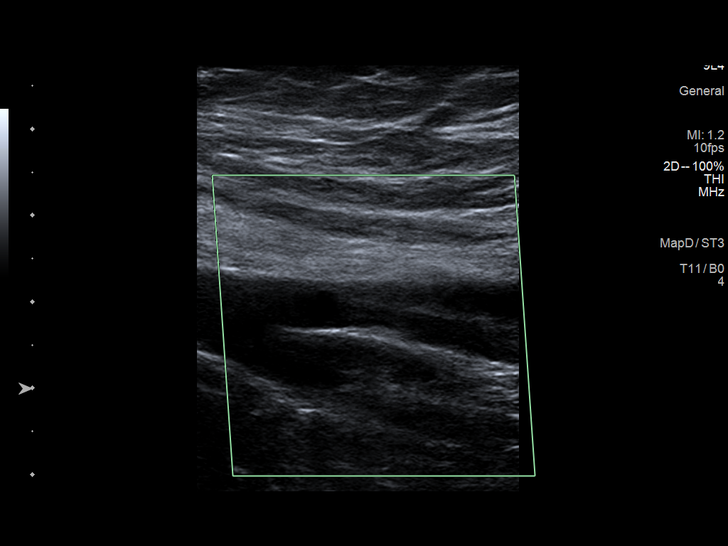
[im 33/51]
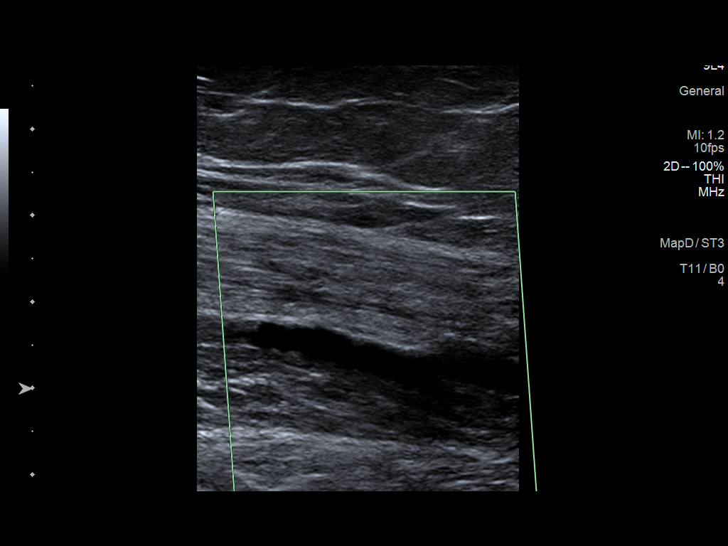
[im 37/51]
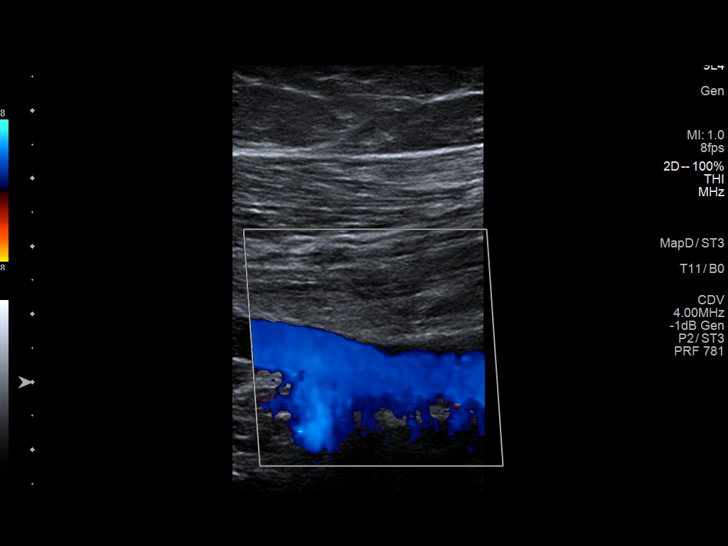
[im 42/51]
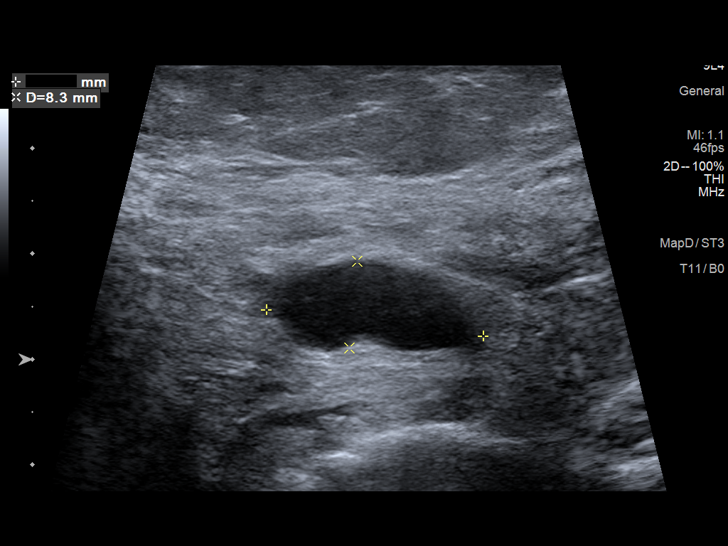
[im 46/51]
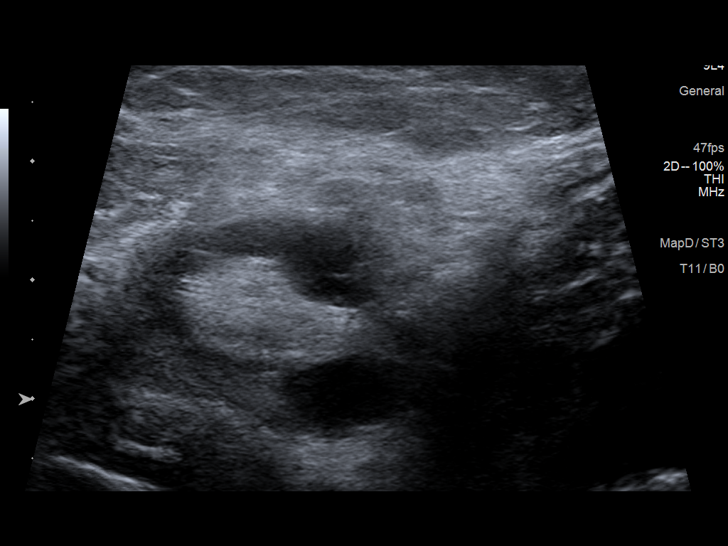
[im 51/51]
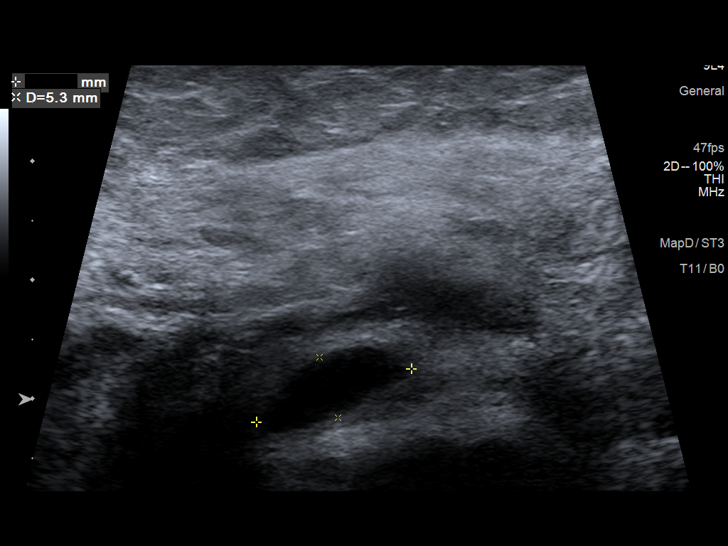

[13 of 24 positions shown; findings below may reference images not displayed]

FINDINGS: VENOUS

Normal compressibility of the common femoral, superficial femoral,
and popliteal veins, as well as the visualized calf veins.
Visualized portions of profunda femoral vein and great saphenous
vein unremarkable. No filling defects to suggest DVT on grayscale or
color Doppler imaging. Doppler waveforms show normal direction of
venous flow, normal respiratory plasticity and response to
augmentation.

Limited views of the contralateral common femoral vein are
unremarkable.

OTHER

There are two small hypoechoic collections in the right popliteal
fossa measuring 2.1 x 0.8 x 2.2 cm and 1.4 x 0 5 x 1.1 cm.

Limitations: none
IMPRESSION: 1.  Negative for DVT in the right lower extremity.

2. There are two small hypoechoic collections in right popliteal
fossa measuring 2 1 cm and 1.4 cm, possibly Harper cysts.

## 2022-05-16 ENCOUNTER — Encounter: Payer: Self-pay | Admitting: *Deleted

## 2022-06-05 ENCOUNTER — Telehealth: Payer: Self-pay | Admitting: General Practice

## 2022-06-05 NOTE — Transitions of Care (Post Inpatient/ED Visit) (Signed)
   06/05/2022  Name: Vanessa Snyder MRN: [id number] DOB: [birth year]  Today's TOC FU Call Status: Today's TOC FU Call Status:: Successful TOC FU Call Competed TOC FU Call Complete Date: 06/05/22  Transition Care Management Follow-up Telephone Call Date of Discharge: 06/04/22 Discharge Facility: Other ([facility]) Name of Other ([facility]) Discharge Facility: [facility] Type of Discharge: Emergency Department Reason for ED Visit: Other: (Fall) How have you been since you were released from the hospital?: Better Any questions or concerns?: No  Items Reviewed: Did you receive and understand the discharge instructions provided?: Yes Medications obtained,verified, and reconciled?: Yes (Medications Reviewed) Any new allergies since your discharge?: No Dietary orders reviewed?: NA Do you have support at home?: Yes  Medications Reviewed Today: Medications Reviewed Today     Reviewed by Modesto Charon, RN (Registered Nurse) on 06/05/22 at 1101  Med List Status: <None>   Medication Order Taking? Sig Documenting Provider Last Dose Status Informant  acetaminophen (TYLENOL) 650 MG CR tablet [id number] No Take 1 tablet (650 mg total) by mouth every 8 (eight) hours as needed for pain. Monica Becton, MD Taking Active   buPROPion [facility] XL) 150 MG 24 hr tablet [id number]  TAKE 1 TABLET BY MOUTH DAILY Monica Becton, MD  Active   ELIQUIS 5 MG TABS tablet [id number]  TAKE 1 TABLET BY MOUTH TWICE  DAILY Monica Becton, MD  Active   gabapentin (NEURONTIN) 300 MG capsule [id number]  TAKE 1 CAPSULE BY MOUTH TWICE  DAILY Monica Becton, MD  Active   Hylan (SYNVISC) 16 MG/2ML SOSY [id number] No Inject 1 syringe into both knees weekly x3, dx bilateral knee OA  Patient not taking: Reported on 08/17/2021   Monica Becton, MD Not Taking Active Self  Misc Natural Products (GLUCOSAMINE CHOND COMPLEX/MSM PO) [id number] No Take 1 tablet by mouth daily.  Patient not taking:  Reported on 08/17/2021   [provider] Not Taking Active Self  Multiple Vitamins-Minerals (MULTIVITAMIN WITH MINERALS) tablet [id number] No Take 1 tablet by mouth daily. [provider] Taking Active Self  Omega-3 Fatty Acids (FISH OIL PO) [id number] No Take 1,400 mg by mouth daily. [provider] Taking Active Self  sertraline (ZOLOFT) 25 MG tablet [id number]  TAKE 1 TABLET BY MOUTH DAILY Monica Becton, MD  Active             Home Care and Equipment/Supplies: Were Home Health Services Ordered?: NA Any new equipment or medical supplies ordered?: NA  Functional Questionnaire: Do you need assistance with bathing/showering or dressing?: No Do you need assistance with meal preparation?: No Do you need assistance with eating?: No Do you have difficulty maintaining continence: No Do you need assistance with getting out of bed/getting out of a chair/moving?: No Do you have difficulty managing or taking your medications?: No  Follow up appointments reviewed: PCP Follow-up appointment confirmed?: No MD Provider Line [phone] Given: No (patient will call to back to schedule an appt.) [facility]ist Hospital Follow-up appointment confirmed?: NA Do you need transportation to your follow-up appointment?: No Do you understand care options if your condition(s) worsen?: Yes-patient verbalized understanding    SIGNATURE Modesto Charon, RN BSN

## 2022-07-11 ENCOUNTER — Ambulatory Visit: Payer: 59 | Admitting: Sports Medicine

## 2022-07-16 ENCOUNTER — Telehealth: Payer: Self-pay

## 2022-07-16 NOTE — Telephone Encounter (Signed)
Attempt to reach patient to schedule annual exam

## 2022-07-17 ENCOUNTER — Other Ambulatory Visit: Payer: Self-pay | Admitting: Sports Medicine

## 2022-07-17 DIAGNOSIS — Z1231 Encounter for screening mammogram for malignant neoplasm of breast: Secondary | ICD-10-CM

## 2022-08-20 NOTE — Progress Notes (Unsigned)
   ANNUAL EXAM Patient name: Vanessa Snyder MRN [id number]  Date of birth: [birth year] Chief Complaint:   No chief complaint on file.  History of Present Illness:   Vanessa Snyder is a 62 y.o. [phone] female being seen today for a routine annual exam.   Current complaints: ***  No LMP recorded. Patient is postmenopausal.   Last MXR: Scheduled for following this appt Last Pap/Pap History: 07/2020. Results were: NILM w/ HRHPV negative. H/O abnormal pap: no    Health Maintenance Due  Topic Date Due   COVID-19 Vaccine (3 - 2023-24 season) 09/29/2021    Review of Systems:   Pertinent items are noted in HPI Denies any headaches, blurred vision, fatigue, shortness of breath, chest pain, abdominal pain, abnormal vaginal discharge/itching/odor/irritation, problems with periods, bowel movements, urination, or intercourse unless otherwise stated above. *** Pertinent History Reviewed:  Reviewed past medical,surgical, social and family history.  Reviewed problem list, medications and allergies. Physical Assessment:  There were no vitals filed for this visit.There is no height or weight on file to calculate BMI.   Physical Examination:  General appearance - well appearing, and in no distress Mental status - alert, oriented to person, place, and time Psych:  She has a normal mood and affect Skin - warm and dry, normal color, no suspicious lesions noted Chest - effort normal Heart - normal rate  Breasts - breasts appear normal, no suspicious masses, no skin or nipple changes or axillary nodes Abdomen - soft, nontender, nondistended, no masses or organomegaly Pelvic -  VULVA: normal appearing vulva with no masses, tenderness or lesions  VAGINA: normal appearing vagina with normal color and discharge, no lesions  CERVIX: normal appearing cervix without discharge or lesions, no CMT UTERUS: uterus is felt to be normal size, shape, consistency and nontender  ADNEXA: No adnexal masses or tenderness  noted. Extremities:  No swelling or varicosities noted  Chaperone present for exam  No results found for this or any previous visit (from the past 24 hour(s)).  Assessment & Plan:  Diagnoses and all orders for this visit:  Encounter for annual routine gynecological examination  - Cervical cancer screening: Discussed guidelines. Pap with HPV wnl 07/2020 - Breast Health: Encouraged self breast awareness/SBE. Discussed limits of clinical breast exam for detecting breast cancer. Discussed importance of annual MXR.  MXR scheduled for today - Climacteric/Sexual health: Reviewed typical and atypical symptoms of menopause/peri-menopause. Discussed PMB and to call if any amount of spotting.  - Colonoscopy: up to date - F/U 12 months and prn    No orders of the defined types were placed in this encounter.   Meds: No orders of the defined types were placed in this encounter.   Follow-up: No follow-ups on file.  Milas Hock, MD 08/20/2022 3:42 PM

## 2022-08-22 ENCOUNTER — Ambulatory Visit (INDEPENDENT_AMBULATORY_CARE_PROVIDER_SITE_OTHER): Payer: 59

## 2022-08-22 ENCOUNTER — Encounter: Payer: Self-pay | Admitting: Obstetrics and Gynecology

## 2022-08-22 ENCOUNTER — Other Ambulatory Visit: Payer: Self-pay | Admitting: Sports Medicine

## 2022-08-22 ENCOUNTER — Ambulatory Visit (INDEPENDENT_AMBULATORY_CARE_PROVIDER_SITE_OTHER): Payer: 59 | Admitting: Obstetrics and Gynecology

## 2022-08-22 VITALS — BP 113/75 | HR 56 | Ht 68.0 in | Wt 151.0 lb

## 2022-08-22 DIAGNOSIS — Z1231 Encounter for screening mammogram for malignant neoplasm of breast: Secondary | ICD-10-CM

## 2022-08-22 DIAGNOSIS — Z01419 Encounter for gynecological examination (general) (routine) without abnormal findings: Secondary | ICD-10-CM

## 2022-09-05 DIAGNOSIS — F411 Generalized anxiety disorder: Secondary | ICD-10-CM | POA: Diagnosis not present

## 2022-09-05 DIAGNOSIS — F331 Major depressive disorder, recurrent, moderate: Secondary | ICD-10-CM | POA: Diagnosis not present

## 2022-09-10 ENCOUNTER — Other Ambulatory Visit: Payer: Self-pay | Admitting: Sports Medicine

## 2022-09-10 DIAGNOSIS — F39 Unspecified mood [affective] disorder: Secondary | ICD-10-CM

## 2022-09-10 MED ORDER — SERTRALINE HCL 25 MG PO TABS: 25.0000 mg | ORAL_TABLET | Freq: Every day | ORAL | 3 refills | Status: DC

## 2022-09-10 MED ORDER — BUPROPION HCL ER (XL) 150 MG PO TB24
150.0000 mg | ORAL_TABLET | ORAL | 3 refills | Status: DC
Start: 2022-09-10 — End: 2023-06-18

## 2022-09-13 ENCOUNTER — Telehealth: Payer: Self-pay | Admitting: Sports Medicine

## 2022-09-13 NOTE — Telephone Encounter (Signed)
Pt called. Mail order has changed due to new insurance. Need form signed(form has been faxed) authorizing refills.  To expedite refills please call [phone]

## 2022-10-10 ENCOUNTER — Telehealth: Payer: Self-pay | Admitting: Sports Medicine

## 2022-10-10 DIAGNOSIS — I2609 Other pulmonary embolism with acute cor pulmonale: Secondary | ICD-10-CM

## 2022-10-10 NOTE — Telephone Encounter (Signed)
Pt called in to request refill on Eloquis. She stated that she has a card to get medication at lesser amount. This [facility]. She would like you to [facility].

## 2022-10-11 MED ORDER — APIXABAN 5 MG PO TABS: 5.0000 mg | ORAL_TABLET | Freq: Two times a day (BID) | ORAL | 3 refills | Status: DC

## 2022-10-11 NOTE — Telephone Encounter (Signed)
Done

## 2022-10-11 NOTE — Telephone Encounter (Signed)
Task completed. Please update the patient that rx sent to the preferred pharmacy. Thanks in advance.

## 2022-10-22 DIAGNOSIS — F331 Major depressive disorder, recurrent, moderate: Secondary | ICD-10-CM | POA: Diagnosis not present

## 2022-10-22 DIAGNOSIS — F411 Generalized anxiety disorder: Secondary | ICD-10-CM | POA: Diagnosis not present

## 2022-11-15 DIAGNOSIS — F411 Generalized anxiety disorder: Secondary | ICD-10-CM | POA: Diagnosis not present

## 2022-11-15 DIAGNOSIS — F331 Major depressive disorder, recurrent, moderate: Secondary | ICD-10-CM | POA: Diagnosis not present

## 2022-11-19 DIAGNOSIS — F411 Generalized anxiety disorder: Secondary | ICD-10-CM | POA: Diagnosis not present

## 2022-11-19 DIAGNOSIS — F331 Major depressive disorder, recurrent, moderate: Secondary | ICD-10-CM | POA: Diagnosis not present

## 2022-12-17 DIAGNOSIS — F411 Generalized anxiety disorder: Secondary | ICD-10-CM | POA: Diagnosis not present

## 2022-12-17 DIAGNOSIS — F331 Major depressive disorder, recurrent, moderate: Secondary | ICD-10-CM | POA: Diagnosis not present

## 2022-12-25 ENCOUNTER — Telehealth: Payer: Self-pay

## 2022-12-25 DIAGNOSIS — M51369 Other intervertebral disc degeneration, lumbar region without mention of lumbar back pain or lower extremity pain: Secondary | ICD-10-CM

## 2022-12-25 MED ORDER — GABAPENTIN 300 MG PO CAPS
300.0000 mg | ORAL_CAPSULE | Freq: Two times a day (BID) | ORAL | 0 refills | Status: DC
Start: 2022-12-25 — End: 2023-03-13

## 2022-12-25 NOTE — Telephone Encounter (Signed)
Copied from CRM [id number]. Topic: Clinical - Medication Refill >> Dec 25, 2022 10:08 AM Theodis Sato wrote: Most Recent Primary Care Visit:  Provider: Monica Becton  Department: [facility]  Visit Type: OFFICE VISIT  Date: 10/26/2021  Medication: gabapentin (NEURONTIN) 300 MG capsule   Has the patient contacted their pharmacy? Yes PT states Dr. Benjamin Stain needs to call in the prescription to #[phone] (Agent: If no, request that the patient contact the pharmacy for the refill. If patient does not wish to contact the pharmacy document the reason why and proceed with request.) (Agent: If yes, when and what did the pharmacy advise?)  Is this the correct pharmacy for this prescription? Yes If no, delete pharmacy and type the correct one.  [facility] [place], [place] - [address] [facility] Portal to [facility] [address] [place] [address] [place] [address] Phone: [phone] Fax: [phone] - Call in to pharmacy   Has the prescription been filled recently? Yes  Is the patient out of the medication? No  Has the patient been seen for an appointment in the last year OR does the patient have an upcoming appointment? Yes  Can we respond through MyChart? No  Agent: Please be advised that Rx refills may take up to 3 business days. We ask that you follow-up with your pharmacy.

## 2022-12-25 NOTE — Telephone Encounter (Signed)
Scheduled patient for an appointment. Refill has been sent to [facility].

## 2023-01-07 ENCOUNTER — Ambulatory Visit (INDEPENDENT_AMBULATORY_CARE_PROVIDER_SITE_OTHER): Payer: 59

## 2023-01-07 ENCOUNTER — Ambulatory Visit: Payer: 59 | Admitting: Sports Medicine

## 2023-01-07 ENCOUNTER — Encounter: Payer: Self-pay | Admitting: Sports Medicine

## 2023-01-07 DIAGNOSIS — Z0000 Encounter for general adult medical examination without abnormal findings: Secondary | ICD-10-CM

## 2023-01-07 DIAGNOSIS — E782 Mixed hyperlipidemia: Secondary | ICD-10-CM | POA: Diagnosis not present

## 2023-01-07 DIAGNOSIS — R0789 Other chest pain: Secondary | ICD-10-CM

## 2023-01-07 DIAGNOSIS — R0781 Pleurodynia: Secondary | ICD-10-CM

## 2023-01-07 DIAGNOSIS — S2241XA Multiple fractures of ribs, right side, initial encounter for closed fracture: Secondary | ICD-10-CM

## 2023-01-07 NOTE — Assessment & Plan Note (Addendum)
Very pleasant 62 year old female, accidental fall about 5 weeks ago, impacted right anterolateral chest wall. She currently does have pain although improving at the right costochondral junction approximately sixth or seventh rib, she also has some pain midclavicular line more proximal at around the second rib or third rib. No shortness of breath, no hemoptysis. I would like some baseline rib x-rays. Return as needed for this.  Update: X-rays do confirm acute minimally displaced right anterior sixth and seventh rib fractures.

## 2023-01-07 NOTE — Progress Notes (Addendum)
    Procedures performed today:    None.  Independent interpretation of notes and tests performed by another provider:   None.  Brief History, Exam, Impression, and Recommendations:    Fracture right anterior sixth and seventh ribs Very pleasant 62 year old female, accidental fall about 5 weeks ago, impacted right anterolateral chest wall. She currently does have pain although improving at the right costochondral junction approximately sixth or seventh rib, she also has some pain midclavicular line more proximal at around the second rib or third rib. No shortness of breath, no hemoptysis. I would like some baseline rib x-rays. Return as needed for this.  Update: X-rays do confirm acute minimally displaced right anterior sixth and seventh rib fractures.  Annual physical exam Up-to-date on screenings, ordering routine labs today.    ____________________________________________ Ihor Austin. Benjamin Stain, M.D., ABFM., CAQSM., AME. Primary Care and Sports Medicine [facility]  Adjunct Professor of Family Medicine  [place] [employer]  Restaurant manager, fast food

## 2023-01-07 NOTE — Assessment & Plan Note (Signed)
Up-to-date on screenings, ordering routine labs today.

## 2023-01-08 LAB — COMPREHENSIVE METABOLIC PANEL
ALT: 16 [IU]/L (ref 0–32)
AST: 22 [IU]/L (ref 0–40)
Albumin: 4.5 g/dL (ref 3.9–4.9)
Alkaline Phosphatase: 86 [IU]/L (ref 44–121)
BUN/Creatinine Ratio: 16 (ref 12–28)
BUN: 12 mg/dL (ref 8–27)
Bilirubin Total: 0.5 mg/dL (ref 0.0–1.2)
CO2: 25 mmol/L (ref 20–29)
Calcium: 9.8 mg/dL (ref 8.7–10.3)
Chloride: 102 mmol/L (ref 96–106)
Creatinine, Ser: 0.73 mg/dL (ref 0.57–1.00)
Globulin, Total: 2.1 g/dL (ref 1.5–4.5)
Glucose: 149 mg/dL — ABNORMAL HIGH (ref 70–99)
Potassium: 4 mmol/L (ref 3.5–5.2)
Sodium: 141 mmol/L (ref 134–144)
Total Protein: 6.6 g/dL (ref 6.0–8.5)
eGFR: 92 mL/min/{1.73_m2} (ref >59)

## 2023-01-08 LAB — HEMOGLOBIN A1C
Est. average glucose Bld gHb Est-mCnc: 111 mg/dL
Hgb A1c MFr Bld: 5.5 % (ref 4.8–5.6)

## 2023-01-08 LAB — TSH: TSH: 1.27 u[IU]/mL (ref 0.450–4.500)

## 2023-01-08 LAB — LIPID PANEL
Chol/HDL Ratio: 2.4 {ratio} (ref 0.0–4.4)
Cholesterol, Total: 229 mg/dL — ABNORMAL HIGH (ref 100–199)
HDL: 96 mg/dL (ref >39)
LDL Chol Calc (NIH): 125 mg/dL — ABNORMAL HIGH (ref 0–99)
Triglycerides: 48 mg/dL (ref 0–149)
VLDL Cholesterol Cal: 8 mg/dL (ref 5–40)

## 2023-01-08 LAB — CBC
Hematocrit: 38.5 % (ref 34.0–46.6)
Hemoglobin: 12.7 g/dL (ref 11.1–15.9)
MCH: 32.2 pg (ref 26.6–33.0)
MCHC: 33 g/dL (ref 31.5–35.7)
MCV: 98 fL — ABNORMAL HIGH (ref 79–97)
Platelets: 308 10*3/uL (ref 150–450)
RBC: 3.95 x10E6/uL (ref 3.77–5.28)
RDW: 12.1 % (ref 11.7–15.4)
WBC: 4.7 10*3/uL (ref 3.4–10.8)

## 2023-02-18 DIAGNOSIS — F411 Generalized anxiety disorder: Secondary | ICD-10-CM | POA: Diagnosis not present

## 2023-02-18 DIAGNOSIS — F331 Major depressive disorder, recurrent, moderate: Secondary | ICD-10-CM | POA: Diagnosis not present

## 2023-03-13 ENCOUNTER — Ambulatory Visit (INDEPENDENT_AMBULATORY_CARE_PROVIDER_SITE_OTHER): Payer: 59

## 2023-03-13 ENCOUNTER — Ambulatory Visit: Payer: 59 | Admitting: Sports Medicine

## 2023-03-13 DIAGNOSIS — Z13820 Encounter for screening for osteoporosis: Secondary | ICD-10-CM | POA: Diagnosis not present

## 2023-03-13 DIAGNOSIS — Z780 Asymptomatic menopausal state: Secondary | ICD-10-CM | POA: Diagnosis not present

## 2023-03-13 DIAGNOSIS — Z0000 Encounter for general adult medical examination without abnormal findings: Secondary | ICD-10-CM | POA: Diagnosis not present

## 2023-03-13 DIAGNOSIS — M810 Age-related osteoporosis without current pathological fracture: Secondary | ICD-10-CM

## 2023-03-13 DIAGNOSIS — M180 Bilateral primary osteoarthritis of first carpometacarpal joints: Secondary | ICD-10-CM

## 2023-03-13 DIAGNOSIS — M51369 Other intervertebral disc degeneration, lumbar region without mention of lumbar back pain or lower extremity pain: Secondary | ICD-10-CM

## 2023-03-13 MED ORDER — GABAPENTIN 300 MG PO CAPS: 300.0000 mg | ORAL_CAPSULE | Freq: Two times a day (BID) | ORAL | 3 refills | Status: DC

## 2023-03-13 NOTE — Progress Notes (Signed)
    Procedures performed today:    Procedure: Real-time Ultrasound Guided injection of the left first carpometacarpal Device: Samsung HS60  Verbal informed consent obtained.  Time-out conducted.  Noted no overlying erythema, induration, or other signs of local infection.  Skin prepped in a sterile fashion.  Local anesthesia: Topical Ethyl chloride.  With sterile technique and under real time ultrasound guidance: Arthritic joint noted, 0.5 cc lidocaine, 0.5 cc kenalog 40 injected easily. Completed without difficulty  Advised to call if fevers/chills, erythema, induration, drainage, or persistent bleeding.  Images permanently stored and available for review in PACS.  Impression: Technically successful ultrasound guided injection.  Independent interpretation of notes and tests performed by another provider:   None.  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  Annual physical exam Vanessa Snyder is up-to-date on screenings with the exception of bone densitometry, ordering this now.  Osteoporosis Bone density -2.4, risk of major osteoporotic fracture over 20%, this essentially necessitates treatment for osteoporosis, I would recommend Prolia every 6 months, awaiting patient input. Adding calcium and vitamin D supplementation.    ____________________________________________ Ihor Austin. Benjamin Stain, M.D., ABFM., CAQSM., AME. Primary Care and Sports Medicine [facility]  Adjunct Professor of Family Medicine  [employer]  Restaurant manager, fast food

## 2023-03-13 NOTE — Assessment & Plan Note (Signed)
Vanessa Snyder is up-to-date on screenings with the exception of bone densitometry, ordering this now.

## 2023-03-13 NOTE — Assessment & Plan Note (Signed)
Known bilateral CMC osteoarthritis, left side worse than right, has not responded to conservative treatment including activity modification, Tylenol, we did a left first CMC injection today, return to see me in 6 weeks.

## 2023-03-14 DIAGNOSIS — M810 Age-related osteoporosis without current pathological fracture: Secondary | ICD-10-CM | POA: Insufficient documentation

## 2023-03-14 MED ORDER — CALCIUM 600+D PLUS MINERALS 600-400 MG-UNIT PO TABS: ORAL_TABLET | ORAL | 3 refills | Status: DC

## 2023-03-14 NOTE — Addendum Note (Signed)
Addended by: Monica Becton on: 03/14/2023 09:17 AM   Modules accepted: Orders

## 2023-03-14 NOTE — Assessment & Plan Note (Addendum)
Bone density -2.4, risk of major osteoporotic fracture over 20%, this essentially necessitates treatment for osteoporosis, I would recommend Prolia every 6 months, awaiting patient input. Adding calcium and vitamin D supplementation.

## 2023-04-08 ENCOUNTER — Encounter: Payer: Self-pay | Admitting: Medical-Surgical

## 2023-04-08 ENCOUNTER — Ambulatory Visit: Admitting: Medical-Surgical

## 2023-04-08 VITALS — BP 111/71 | HR 61 | Temp 98.5°F | Resp 20 | Ht 68.0 in | Wt 159.1 lb

## 2023-04-08 DIAGNOSIS — J029 Acute pharyngitis, unspecified: Secondary | ICD-10-CM

## 2023-04-08 LAB — POCT INFLUENZA A/B
Influenza A, POC: NEGATIVE
Influenza B, POC: NEGATIVE

## 2023-04-08 LAB — POC COVID19 BINAXNOW: SARS Coronavirus 2 Ag: NEGATIVE

## 2023-04-08 LAB — POCT RAPID STREP A (OFFICE): Rapid Strep A Screen: NEGATIVE

## 2023-04-08 MED ORDER — BENZONATATE 100 MG PO CAPS: 100.0000 mg | ORAL_CAPSULE | Freq: Three times a day (TID) | ORAL | 0 refills | Status: DC | PRN

## 2023-04-08 MED ORDER — BENZONATATE 100 MG PO CAPS: 100.0000 mg | ORAL_CAPSULE | Freq: Three times a day (TID) | ORAL | 0 refills | Status: AC | PRN

## 2023-04-08 MED ORDER — IPRATROPIUM BROMIDE 0.03 % NA SOLN
2.0000 | Freq: Two times a day (BID) | NASAL | 0 refills | Status: DC
Start: 2023-04-08 — End: 2023-05-01

## 2023-04-08 NOTE — Patient Instructions (Signed)
 Medications & Home Remedies for Upper Respiratory Illness   Note: the following list assumes no pregnancy, normal liver & kidney function and no other drug interactions. Always ask a pharmacist or qualified medical provider if you have any questions!    Aches/Pains, Fever, Headache OTC Acetaminophen (Tylenol) 500 mg tablets - take max 2 tablets (1000 mg) every 6 hours (4 times per day)  OTC Ibuprofen (Motrin) 200 mg tablets - take max 4 tablets (800 mg) every 6 hours*   Sinus Congestion Prescription Atrovent as directed OTC Nasal Saline if desired to rinse OTC Oxymetolazone (Afrin, others) sparing use due to rebound congestion, NEVER use in kids OTC Phenylephrine (Sudafed) 10 mg tablets every 4 hours (or the 12-hour formulation)* OTC Diphenhydramine (Benadryl) 25 mg tablets - take max 2 tablets every 4 hours   Cough & Sore Throat Prescription cough pills or syrups as directed OTC Dextromethorphan (Robitussin, others) - cough suppressant OTC Guaifenesin (Robitussin, Mucinex, others) - expectorant (helps cough up mucus) (Dextromethorphan and Guaifenesin also come in a combination tablet/syrup) OTC Lozenges w/ Benzocaine + Menthol (Cepacol) Honey - as much as you want! Teas which "coat the throat" - look for ingredients Elm Bark, Licorice Root, Marshmallow Root   Other Prescription Oral Steroids to decrease inflammation and improve energy Prescription Antibiotics if these are necessary for bacterial infection - take ALL, even if you're feeling better  OTC Zinc Lozenges within 24 hours of symptoms onset - mixed evidence this shortens the duration of the common cold Don't waste your money on Vitamin C or Echinacea in acute illness - it's already too late!    *Caution in patients with high blood pressure

## 2023-04-08 NOTE — Progress Notes (Unsigned)
 Subjective:  Patient ID: Vanessa Snyder, female    DOB: 09/26/1960, 63 y.o.   MRN: [id number]  Patient Care Team: Monica Becton, MD as PCP - General (Sports Medicine)   Chief Complaint:  Sore Throat   HPI:  Vanessa Snyder is a 63 y.o. female presenting on 04/08/2023 for Sore Throat   History, Exam,  Impression and Plan  Sorethroat Presents with 4-day history of sore throat hoarseness and "raspy" voice that has progressively gotten worse. Denies being around any sick family, friends or coworkers.  Reports feeling feverish but temperature was 98.5 today.  Admits to cough that is worse in mornings. Reports tenderness over submaxillary lymph nodes bilaterally.  Distant history of tonsillectomy as a child.  On exam has hoarse raspy voice. Positive injection to pharynx. Able to swallow on command.  Grimace with palpation of submaxillary lymph nodes unable to palpate individual lymph nodes.  TMs are pearly gray and clear without erythema and have positive light reflection.  Negative edema or erythema of external ear canal.  Clinical presentation is consistent with viral upper respiratory infection.  POCT influenza and COVID-19 are negative.  POCT strep negative. -     POCT Influenza A/B -     POCT rapid strep A -     POC COVID-19 -     ipratropium (ATROVENT) 0.03 % nasal spray; Place 2 sprays into both nostrils every 12 (twelve) hours. -     Discontinue: benzonatate (TESSALON) 100 MG capsule; Take 1 capsule (100 mg total) by mouth 3 (three) times daily as needed for cough. -     benzonatate (TESSALON) 100 MG capsule; Take 1 capsule (100 mg total) by mouth 3 (three) times daily as needed for cough.  Continue all other maintenance medications.  Follow up plan: Return if symptoms worsen or fail to improve.    Relevant past medical, surgical, family, and social history reviewed and updated as indicated.  Allergies and medications reviewed and updated. Data reviewed: Chart in  Epic.   Past Medical History:  Diagnosis Date   Anxiety    Arthritis    Chronic pulmonary embolism (HCC)     Past Surgical History:  Procedure Laterality Date   APPENDECTOMY     AUGMENTATION MAMMAPLASTY     CESAREAN SECTION     KNEE ARTHROSCOPY Bilateral 03/18/2020   Procedure: Left Knee partial medial menisectomy. Medial and Patella femoral chondroplasty. Medial plica. Right knee partial lateral medial menisectomy. Medial patella and femoral chondroplasty, medial plica excison.;  Surgeon: Jodi Geralds, MD;  Location: [facility];  Service: Orthopedics;  Laterality: Bilateral;   PLACEMENT OF BREAST IMPLANTS     TONSILLECTOMY      Social History   Socioeconomic History   Marital status: Married    Spouse name: Not on file   Number of children: Not on file   Years of education: Not on file   Highest education level: Not on file  Occupational History   Not on file  Tobacco Use   Smoking status: Former   Smokeless tobacco: Never   Tobacco comments:    auit 27 years ago   Vaping Use   Vaping status: Never Used  Substance and Sexual Activity   Alcohol use: Yes    Alcohol/week: 0.0 standard drinks of alcohol    Comment: occas    Drug use: Never   Sexual activity: Yes    Birth control/protection: Post-menopausal  Other Topics Concern   Not on file  Social History  Narrative   Not on file   Social Drivers of Health   Financial Resource Strain: Not on file  Food Insecurity: Not on file  Transportation Needs: No Transportation Needs (06/05/2022)   PRAPARE - Administrator, Civil Service (Medical): No    Lack of Transportation (Non-Medical): No  Physical Activity: Not on file  Stress: Not on file  Social Connections: Unknown (06/09/2021)   Received from [facility], [facility]   Social Network    Social Network: Not on file  Intimate Partner Violence: Not At Risk (06/04/2022)   Received from [facility], [facility]   HITS    Over the last 12 months how  often did your partner physically hurt you?: Never    Over the last 12 months how often did your partner insult you or talk down to you?: Never    Over the last 12 months how often did your partner threaten you with physical harm?: Never    Over the last 12 months how often did your partner scream or curse at you?: Never    Outpatient Encounter Medications as of 04/08/2023  Medication Sig   apixaban (ELIQUIS) 5 MG TABS tablet Take 1 tablet (5 mg total) by mouth 2 (two) times daily.   buPROPion (WELLBUTRIN XL) 150 MG 24 hr tablet Take 1 tablet (150 mg total) by mouth every morning.   Calcium Carbonate-Vit D-Min (CALCIUM 600+D PLUS MINERALS) 600-400 MG-UNIT TABS 1 tab p.o. twice daily   gabapentin (NEURONTIN) 300 MG capsule Take 1 capsule (300 mg total) by mouth 2 (two) times daily.   ipratropium (ATROVENT) 0.03 % nasal spray Place 2 sprays into both nostrils every 12 (twelve) hours.   sertraline (ZOLOFT) 25 MG tablet Take 1 tablet (25 mg total) by mouth daily.   [DISCONTINUED] benzonatate (TESSALON) 100 MG capsule Take 1 capsule (100 mg total) by mouth 3 (three) times daily as needed for cough.   benzonatate (TESSALON) 100 MG capsule Take 1 capsule (100 mg total) by mouth 3 (three) times daily as needed for cough.   No facility-administered encounter medications on file as of 04/08/2023.    No Known Allergies  Review of Systems      Objective:  BP 111/71 (BP Location: Right Arm, Cuff Size: Normal)   Pulse 61   Temp 98.5 F (36.9 C) (Oral)   Resp 20   Ht 5\' 8"  (1.727 m)   Wt 159 lb 1.6 oz (72.2 kg)   SpO2 100%   BMI 24.19 kg/m    Wt Readings from Last 3 Encounters:  04/08/23 159 lb 1.6 oz (72.2 kg)  08/22/22 151 lb (68.5 kg)  10/26/21 169 lb 14.4 oz (77.1 kg)    Physical Exam  Results for orders placed or performed in visit on 04/08/23  POCT Influenza A/B   Collection Time: 04/08/23  4:57 PM  Result Value Ref Range   Influenza A, POC Negative Negative   Influenza B, POC  Negative Negative  POCT rapid strep A   Collection Time: 04/08/23  4:57 PM  Result Value Ref Range   Rapid Strep A Screen Negative Negative  POC COVID-19   Collection Time: 04/08/23  4:57 PM  Result Value Ref Range   SARS Coronavirus 2 Ag Negative Negative       Pertinent labs & imaging results that were available during my care of the patient were reviewed by me and considered in my medical decision making.   Continue healthy lifestyle choices, including  diet (rich in fruits, vegetables, and lean proteins, and low in salt and simple carbohydrates) and exercise (at least 30 minutes of moderate physical activity daily).  The above assessment and management plan was discussed with the patient. The patient verbalized understanding of and has agreed to the management plan. Patient is aware to call the clinic if they develop any new symptoms or if symptoms persist or worsen. Patient is aware when to return to the clinic for a follow-up visit. Patient educated on when it is appropriate to go to the emergency department.   Maryelizabeth Kaufmann Student AGNP

## 2023-04-09 NOTE — Progress Notes (Signed)
 Medical screening examination/treatment was performed by qualified clinical staff member and as supervising provider I was immediately available for consultation/collaboration. I have reviewed documentation and agree with assessment and plan.  Thayer Ohm, DNP, APRN, FNP-BC [facility]

## 2023-04-24 ENCOUNTER — Ambulatory Visit: Payer: 59 | Admitting: Sports Medicine

## 2023-04-30 ENCOUNTER — Other Ambulatory Visit: Payer: Self-pay | Admitting: Medical-Surgical

## 2023-06-11 DIAGNOSIS — K080 Exfoliation of teeth due to systemic causes: Secondary | ICD-10-CM | POA: Diagnosis not present

## 2023-06-18 ENCOUNTER — Telehealth: Payer: Self-pay

## 2023-06-18 DIAGNOSIS — F39 Unspecified mood [affective] disorder: Secondary | ICD-10-CM

## 2023-06-18 DIAGNOSIS — I2609 Other pulmonary embolism with acute cor pulmonale: Secondary | ICD-10-CM

## 2023-06-18 DIAGNOSIS — M51369 Other intervertebral disc degeneration, lumbar region without mention of lumbar back pain or lower extremity pain: Secondary | ICD-10-CM

## 2023-06-18 MED ORDER — GABAPENTIN 300 MG PO CAPS: 300.0000 mg | ORAL_CAPSULE | Freq: Two times a day (BID) | ORAL | 2 refills | Status: DC

## 2023-06-18 MED ORDER — BUPROPION HCL ER (XL) 150 MG PO TB24: 150.0000 mg | ORAL_TABLET | ORAL | 3 refills | Status: DC

## 2023-06-18 MED ORDER — APIXABAN 5 MG PO TABS: 5.0000 mg | ORAL_TABLET | Freq: Two times a day (BID) | ORAL | 3 refills | Status: DC

## 2023-06-18 MED ORDER — SERTRALINE HCL 25 MG PO TABS: 25.0000 mg | ORAL_TABLET | Freq: Every day | ORAL | 0 refills | Status: DC

## 2023-06-18 MED ORDER — GABAPENTIN 300 MG PO CAPS: 300.0000 mg | ORAL_CAPSULE | Freq: Two times a day (BID) | ORAL | 3 refills | Status: DC

## 2023-06-18 MED ORDER — APIXABAN 5 MG PO TABS: 5.0000 mg | ORAL_TABLET | Freq: Two times a day (BID) | ORAL | 0 refills | Status: DC

## 2023-06-18 MED ORDER — BUPROPION HCL ER (XL) 150 MG PO TB24: 150.0000 mg | ORAL_TABLET | ORAL | 0 refills | Status: DC

## 2023-06-18 MED ORDER — SERTRALINE HCL 25 MG PO TABS: 25.0000 mg | ORAL_TABLET | Freq: Every day | ORAL | 3 refills | Status: DC

## 2023-06-18 NOTE — Telephone Encounter (Signed)
 Called and left detailed voice mail message on patient home # ( allowed on DPR ) =

## 2023-06-18 NOTE — Telephone Encounter (Signed)
 Copied from CRM [id number]. Topic: Clinical - Prescription Issue >> Jun 18, 2023  8:39 AM Vanessa Snyder wrote: Reason for CRM: Patient called and is waiting for Dr. Elva Hamburger to [facility] of [facility], she is almost low of her medicines, the fax number for [facility] is [phone] or you can call for faxing instructions at [phone], patient states the only medicines she needs sent is listed below.   apixaban  (ELIQUIS ) 5 MG TABS tablet buPROPion  (WELLBUTRIN  XL) 150 MG 24 hr tablet gabapentin  (NEURONTIN ) 300 MG capsule sertraline  (ZOLOFT ) 25 MG tablet  Patient is requesting a callback once complete her callback number is [phone].

## 2023-06-18 NOTE — Addendum Note (Signed)
 Addended by: Gean Keels on: 06/18/2023 12:52 PM   Modules accepted: Orders

## 2023-06-18 NOTE — Telephone Encounter (Signed)
 Done

## 2023-06-18 NOTE — Telephone Encounter (Signed)
 Requesting rx rf of  Apixaban  5mg   Last written 10/11/2022 [facility]  Bupropion  XL 150mg  Last wirtten 09/10/2022 should have refills at [facility] Gabapentin  300mg  Last written  03/13/2023 should have refills at [facility] Sertraline  25mg   Last written 0812/24 should have refills at [facility]   Patient [facility]  Remaining [facility] of [facility] per [facility].

## 2023-07-31 ENCOUNTER — Other Ambulatory Visit: Payer: Self-pay | Admitting: Sports Medicine

## 2023-07-31 DIAGNOSIS — Z1231 Encounter for screening mammogram for malignant neoplasm of breast: Secondary | ICD-10-CM

## 2023-08-29 ENCOUNTER — Encounter: Payer: Self-pay | Admitting: Obstetrics and Gynecology

## 2023-08-29 ENCOUNTER — Ambulatory Visit: Admitting: Obstetrics and Gynecology

## 2023-08-29 ENCOUNTER — Ambulatory Visit

## 2023-08-29 VITALS — BP 118/79 | HR 57 | Ht 68.0 in | Wt 164.0 lb

## 2023-08-29 DIAGNOSIS — Z01419 Encounter for gynecological examination (general) (routine) without abnormal findings: Secondary | ICD-10-CM

## 2023-08-29 DIAGNOSIS — M8580 Other specified disorders of bone density and structure, unspecified site: Secondary | ICD-10-CM

## 2023-08-29 DIAGNOSIS — Z1231 Encounter for screening mammogram for malignant neoplasm of breast: Secondary | ICD-10-CM

## 2023-08-29 DIAGNOSIS — Z803 Family history of malignant neoplasm of breast: Secondary | ICD-10-CM

## 2023-08-29 NOTE — Progress Notes (Signed)
 ANNUAL EXAM Patient name: Vanessa Snyder MRN [id number]  Date of birth: 10-Jan-1961 Chief Complaint:   Gynecologic Exam (No concerns reported.)  History of Present Illness:   Vanessa Snyder is a 63 y.o. [phone] female being seen today for a routine annual exam.   Current complaints: No concerns.  She has osteopenia by DEXA this year.   No LMP recorded. Patient is postmenopausal.  Last MXR: Performed today.  Breasts are not dense.  Last Pap/Pap History: 07/2020. Results were: NILM w/ HRHPV negative. H/O abnormal pap: no    Health Maintenance Due  Topic Date Due   Medicare Annual Wellness (AWV)  Never done   Pneumococcal Vaccine: 50+ Years (1 of 2 - PCV) Never done   COVID-19 Vaccine (3 - 2024-25 season) 09/30/2022   MAMMOGRAM  08/22/2023    Review of Systems:   Pertinent items are noted in HPI Denies any headaches, blurred vision, fatigue, shortness of breath, chest pain, abdominal pain, abnormal vaginal discharge/itching/odor/irritation, problems with periods, bowel movements, urination, or intercourse unless otherwise stated above.  Pertinent History Reviewed:  Reviewed past medical,surgical, social and family history.  Reviewed problem list, medications and allergies. Physical Assessment:   Vitals:   08/29/23 1049  BP: 118/79  Pulse: (!) 57  Weight: 164 lb (74.4 kg)  Height: 5' 8 (1.727 m)  Body mass index is 24.94 kg/m.   Physical Examination:  General appearance - well appearing, and in no distress Mental status - alert, oriented to person, place, and time Psych:  She has a normal mood and affect Skin - warm and dry, normal color, no suspicious lesions noted Chest - effort normal Heart - normal rate  Breasts - breasts appear normal, no suspicious masses, no skin or nipple changes or axillary nodes Abdomen - soft, nontender, nondistended, no masses or organomegaly Pelvic -  VULVA: normal appearing vulva with no masses, tenderness or lesions  VAGINA: normal  appearing vagina with normal color and discharge, no lesions  CERVIX: normal appearing cervix without discharge or lesions, no CMT UTERUS: uterus is felt to be normal size, shape, consistency and nontender  ADNEXA: No adnexal masses or tenderness noted. Extremities:  No swelling or varicosities noted  Chaperone present for exam  No results found for this or any previous visit (from the past 24 hours).  Assessment & Plan:  Vanessa Snyder was seen today for gynecologic exam.  Diagnoses and all orders for this visit:  Encounter for annual routine gynecological examination  Osteopenia, unspecified location  Family history of breast cancer  - Cervical cancer screening: Discussed guidelines. Pap with HPV wnl 07/2020 - Breast Health: Encouraged self breast awareness/SBE. Discussed limits of clinical breast exam for detecting breast cancer. Discussed importance of annual MXR.  MXR scheduled for today - Climacteric/Sexual health: Reviewed typical and atypical symptoms of menopause/peri-menopause. Discussed PMB and to call if any amount of spotting.  - Colonoscopy: up to date - F/U 12 months and prn  Family history of breast cancer - Her grandfather had breast cancer. No one besides her mom has had breast cancer and she has 5 other siblings.  - Discussed the option for genetic testing and what a positive result would lead to (MRI+MXR, TVUS/CA125 vs mastectomy and BSO). - She declines at this time.   Osteopenia - Patient diagnosed with osteopenia by DEXA - I recommend a diet rich in calcium  i.e. dairy, nuts, dark green vegetables and other fortified sources.  - Discussed sources of vitamin D  - Start or continue weight  bearing exercise i.e. walking, weights, etc.  - Recheck DEXA in 2-3 years to ensure no progression.  - At this time FRAX score does meet criteria for pharmacologic therapy but she declines with PCP.     No orders of the defined types were placed in this encounter.   Meds: No  orders of the defined types were placed in this encounter.   Follow-up: Return in about 1 year (around 08/28/2024) for annual.  Vina Solian, MD 08/29/2023 11:18 AM

## 2023-09-27 IMAGING — DX DG CERVICAL SPINE COMPLETE 4+V
6 series · 6 of 6 positions shown · non-contrast
Comparison: None.

CLINICAL DATA: Worsening neck pain.

EXAM:
CERVICAL SPINE - COMPLETE 4+ VIEW

[c-spine lat]
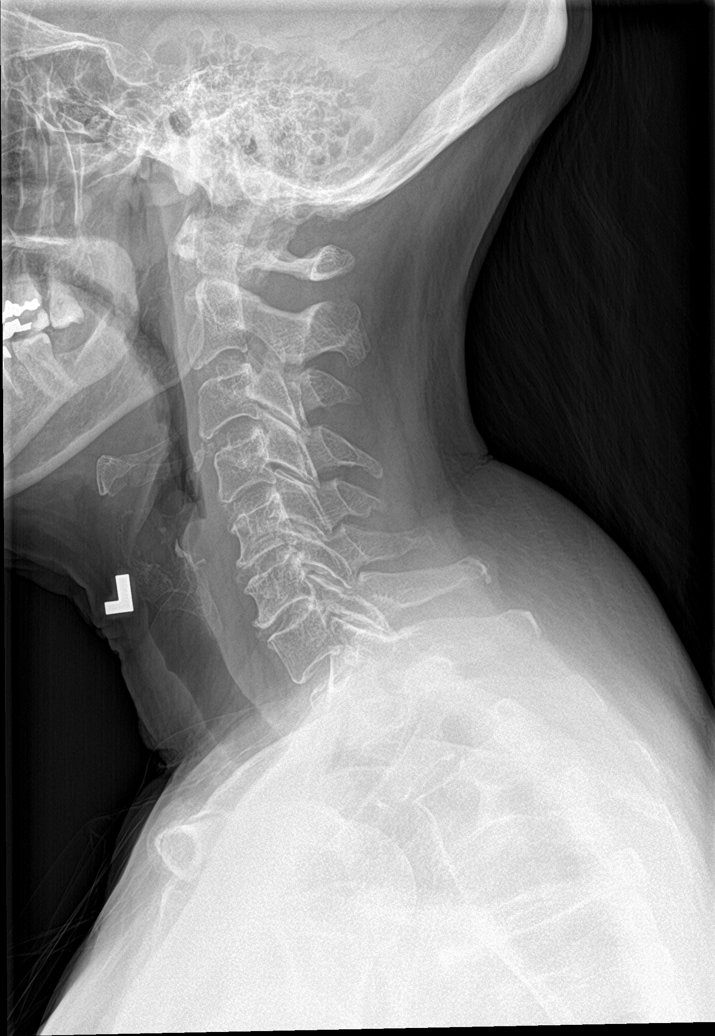

[c-spine obl (1 of 2)]
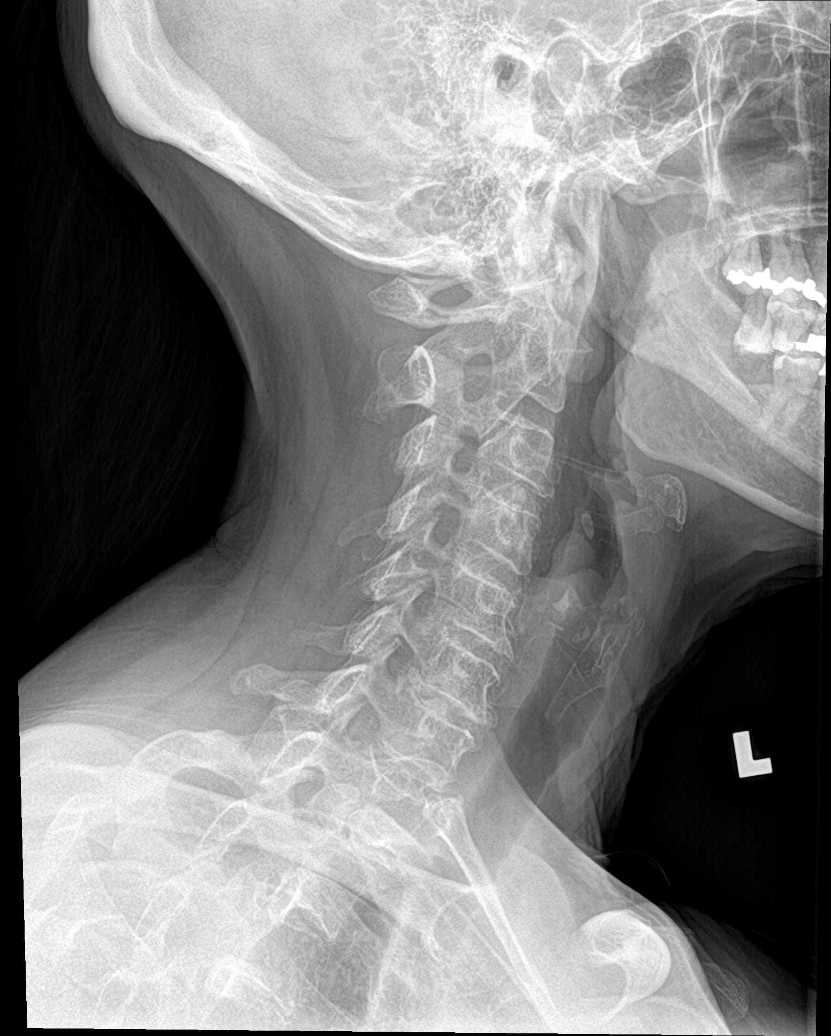

[c-spine obl (2 of 2)]
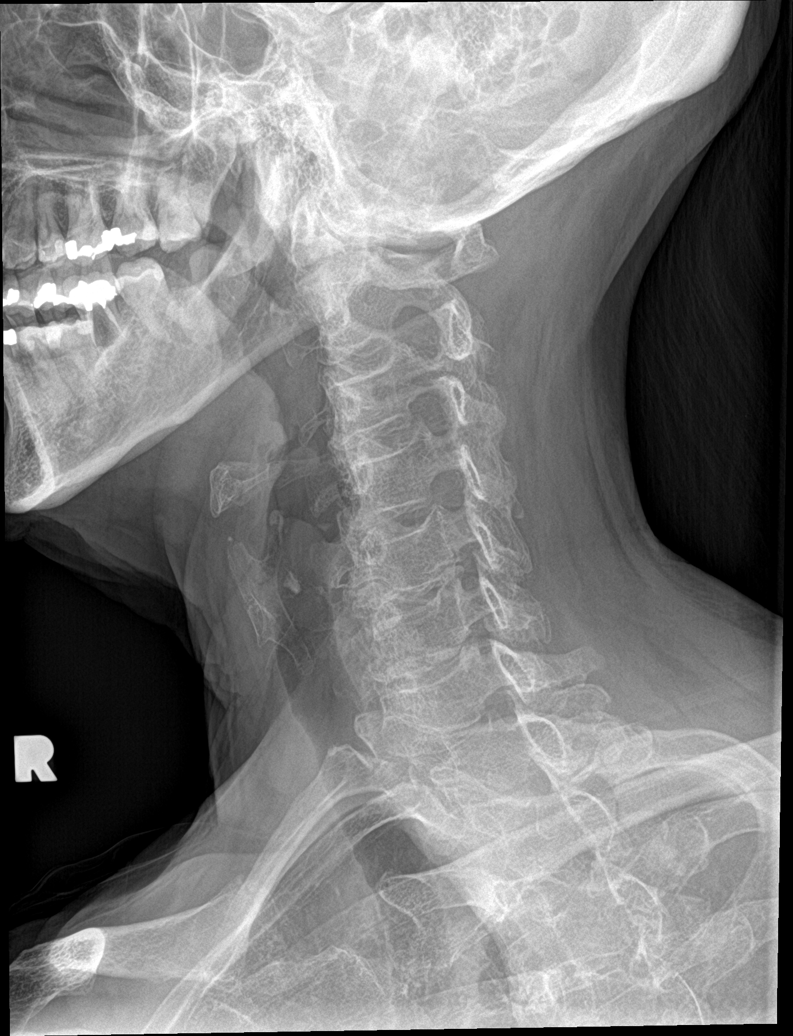

[c-spine ap]
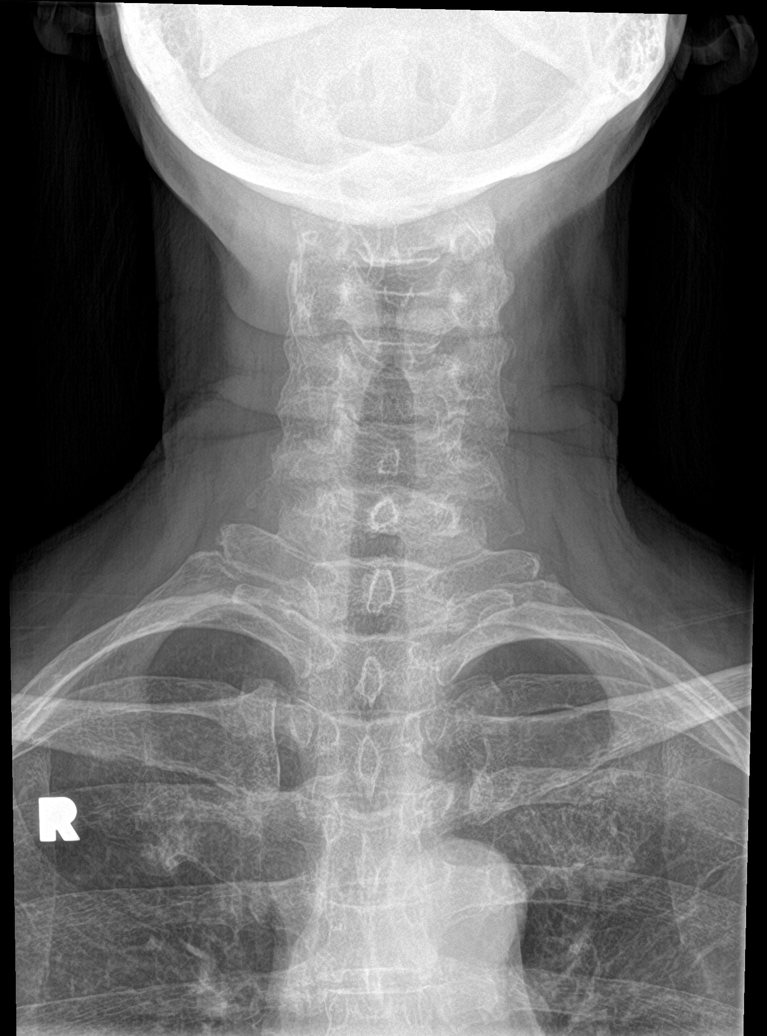

[c-spine open mouth]
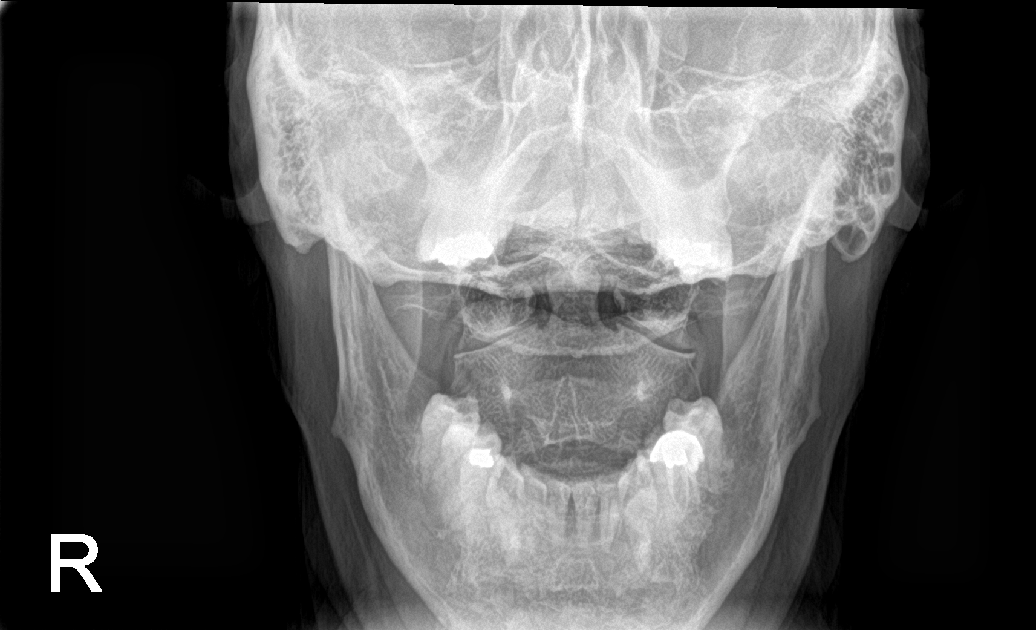

[c-spine swimmers]
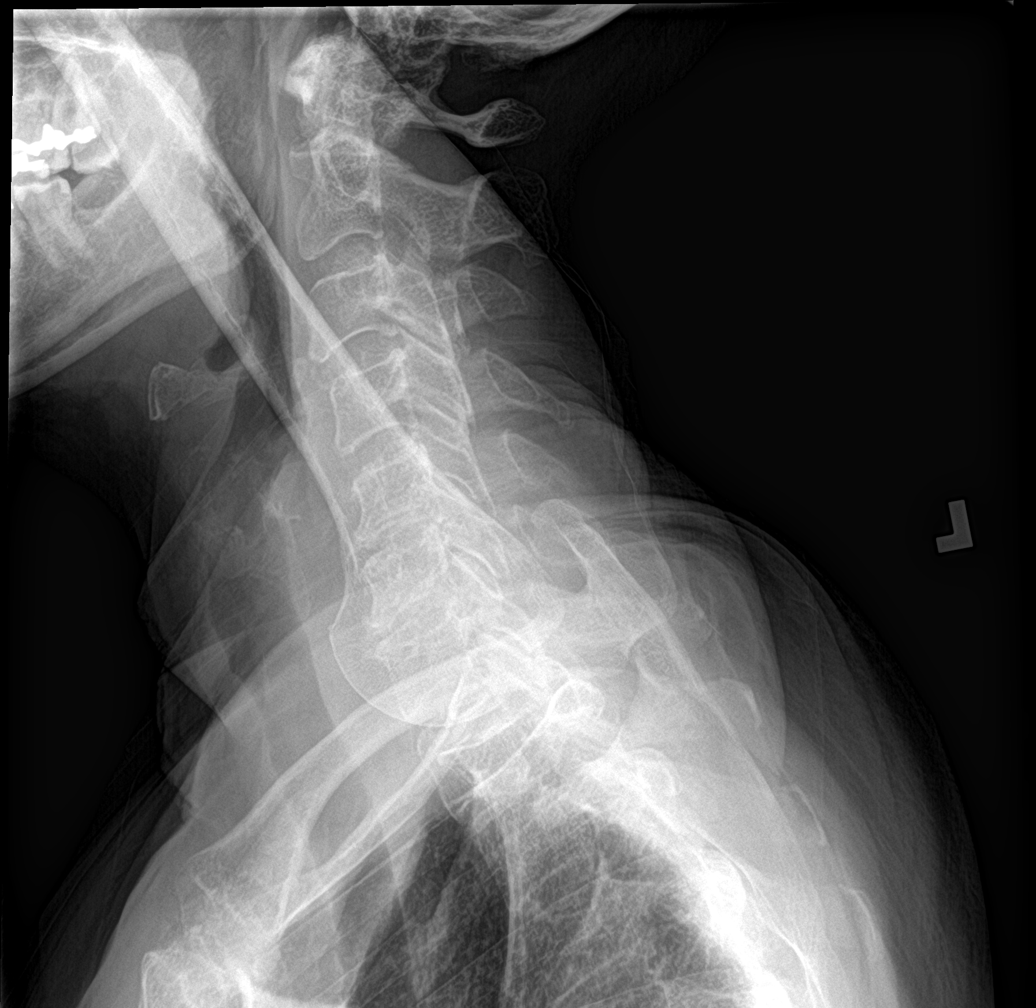

[6 of 6 positions shown; findings below may reference images not displayed]

FINDINGS: There is no evidence of cervical spine fracture or prevertebral soft
tissue swelling. 1-2 mm retrolisthesis of the C5 vertebral body is
noted on C6. Mild to moderate severity endplate sclerosis and mild
intervertebral disc space narrowing are also seen at C4-C5, C5-C6
and C6-C7. No other significant bone abnormalities are identified.
IMPRESSION: Mild to moderate severity degenerative changes at C4-C5, C5-C6 and
C6-C7.

## 2023-10-03 ENCOUNTER — Encounter: Payer: Self-pay | Admitting: Sports Medicine

## 2023-10-28 ENCOUNTER — Other Ambulatory Visit: Payer: Self-pay

## 2023-10-28 ENCOUNTER — Ambulatory Visit (INDEPENDENT_AMBULATORY_CARE_PROVIDER_SITE_OTHER)

## 2023-10-28 VITALS — BP 102/60 | Ht 68.0 in | Wt 164.0 lb

## 2023-10-28 DIAGNOSIS — M170 Bilateral primary osteoarthritis of knee: Secondary | ICD-10-CM

## 2023-10-28 DIAGNOSIS — M180 Bilateral primary osteoarthritis of first carpometacarpal joints: Secondary | ICD-10-CM

## 2023-10-28 MED ORDER — METHYLPREDNISOLONE ACETATE 40 MG/ML IJ SUSP
40.0000 mg | Freq: Once | INTRAMUSCULAR | Status: AC
  Administered 2023-10-28: 40 mg via INTRA_ARTICULAR

## 2023-10-28 MED ORDER — METHYLPREDNISOLONE ACETATE 40 MG/ML IJ SUSP
40.0000 mg | Freq: Once | INTRAMUSCULAR | Status: AC
  Administered 2023-10-28: 20 mg via INTRA_ARTICULAR

## 2023-10-28 NOTE — Progress Notes (Signed)
 Subjective:    Patient ID: Vanessa Snyder, female    DOB: 63 y.o., 03-21-1960   MRN: [id number]  HPI  Chief Complaint: Bilateral knee pain, bilateral thumb pain  Patient previously evaluated by Dr. 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  She received an ultrasound-guided corticosteroid injection into the right knee at that time.  No subsequent injections following this. 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  A left CMC joint corticosteroid injection with ultrasound guidance was conducted at that time. Today patient reports good response from prior corticosteroid injection in left thumb and bilateral knees. 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  10/26/2021 plain film imaging of her bilateral knees: prominent chondrocalcinosis most notable in her left knee lateral compartment though this is also present prominently in the medial compartment of the right knee.  Osteophytic changes noted in bilateral compartments of bilateral knees.   11/01/2021 MRI of the right knee revealing increased bone marrow signal at the medial femoral condyle, tricompartmental cartilage loss, Baker's cyst, multifocal fraying of the meniscus      Objective:   Physical Exam Vitals:   10/28/23 1010  BP: 102/60    Const: appears well, non-toxic, well groomed Psych: affect bright, interactive, smiling EENT: EOMI intact, conjunctiva appear normal Neck: no obvious masses, appears symmetric Resp: non-labored, appears symmetric Neuro: muscle bulk appears  normal Skin: no obvious rashes noted  L thumb: No overlying skin changes.  Tenderness to palpation over the [facility] joint.  Negative WHAT test.  Negative Finkelstein's.  Negative Eickhoff's.  Positive CMC grind.  Full range of motion of the wrist and fingers.  Bilateral knees: Tenderness to palpation along medial joint line.   1st [facility] Joint Injection with Ultrasound Guidance Vanessa Snyder 06/20/1960 Indications: Pain Procedure Details Following the description of risks including infection bleeding, damage to surrounding structures, patient provided verbal/written consent for Left [facility] Joint injection procedure with ultrasound guidance. Ultrasound was used to identify the [facility] joint and was used to guide this procedure. Patient was sterilely prepped in the usual fashion with chlorhexidine . Sterile ultrasound gel was used for the procedure. Following topical anesthetization with ethyl chloride, the patient was injected into the joint with a solution of 20mg  Depo-Medrol and 1cc 2% Mepivicaine. This was well visualized under ultrasound, please see associated photographic documentation. Patient tolerated well without complication. Precautions provided. Cleaned and dressing applied.  Intra-articular Knee Injection with Ultrasound Guidance Procedure Note Vanessa Snyder 1960/03/26 Indications: Pain Procedure Details Following the description of risks including infection, bleeding, damage to surrounding structures, patient provided written consent for left knee joint injection procedure. US  was used to identify the suprapatellar pouch. Patient was sterilely prepped in the usual fashion with alcohol.  Following topical anesthetization with ethyl chloride they were injected with a solution of 40mg  Depo-medrol and 3cc Mepivacaine 2% Lidocaine  via the superolateral approach into the suprapatellar pouch. This was well visualized under ultrasound, please see associated photographic documentation. Patient tolerated well  without complication.  Precautions provided. Cleaned and dressing applied.     Assessment & Plan:  Vanessa Snyder is an exceptionally pleasant 63 y.o. female with well-documented bilateral knee arthritis and CMC joint arthritis both of which have been receptive to corticosteroid injections in the past.  I believe moving forward with repeat injections today is reasonable.  I have talked through therapeutic options with her and also recommend bracing her thumb with the brace which Dr. Curtis provided her previously.  Can consider referral to hand therapy in the future as well.  Do not want to provide 3 steroid injections which would be a high steroid load all at one time.  Will inject her left CMC joint and her left knee today and follow-up in about 2 weeks to provide a corticosteroid injection of her right knee per her preference.  Patient states she understands and agrees to plan.

## 2023-11-11 ENCOUNTER — Ambulatory Visit

## 2023-11-11 ENCOUNTER — Other Ambulatory Visit: Payer: Self-pay

## 2023-11-11 VITALS — BP 102/70 | Ht 68.5 in | Wt 160.0 lb

## 2023-11-11 DIAGNOSIS — M1812 Unilateral primary osteoarthritis of first carpometacarpal joint, left hand: Secondary | ICD-10-CM

## 2023-11-11 DIAGNOSIS — M1711 Unilateral primary osteoarthritis, right knee: Secondary | ICD-10-CM

## 2023-11-11 MED ORDER — KETOROLAC TROMETHAMINE 30 MG/ML IJ SOLN
30.0000 mg | Freq: Once | INTRAMUSCULAR | Status: AC
  Administered 2023-11-11: 30 mg via INTRA_ARTICULAR

## 2023-11-11 MED ORDER — METHYLPREDNISOLONE ACETATE 40 MG/ML IJ SUSP
40.0000 mg | Freq: Once | INTRAMUSCULAR | Status: AC
  Administered 2023-11-11: 40 mg via INTRA_ARTICULAR

## 2023-11-11 NOTE — Progress Notes (Signed)
   Subjective:    Patient ID: Vanessa Snyder, female    DOB: 63 y.o., [birth year]   MRN: [id number]  Chief Complaint: Left CMC joint arthritis.  Right knee osteoarthritis.  Discussed the use of AI scribe software for clinical note transcription with the patient, who gave verbal consent to proceed.  History of Present Illness After the last visit with myself, the patient reports significant pain relief in her left thumb after about 3-4 days.  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  Unfortunately, she feels that doing this flared her pain worse.  She also reports continued pain/stiffness in her right knee.  Review of 10/30/2021  right knee MRI per my independent evaluation/review revealing significant effusion, intact cruciates.  Bony islands in the metadiaphyseal junction of the tibia with no communication with the cortical surface.  Posterior horn of both the medial and lateral menisci.  Prominent degenerative changes in all 3 joint compartments.     Objective:   Vitals:   11/11/23 1012  BP: 102/70    Left thumb: Tenderness to palpation along the CMC joint.  Negative Finkelstein.  Negative Eickhoff's.  Positive CMC grind. Right knee: Tenderness to palpation along the lateral joint line.  Mild effusion.  1st [facility] Joint Injection with Ultrasound Guidance Rozelle Caudle [birth year] Indications: Pain Procedure Details Following the description of risks including infection bleeding, damage to surrounding structures, patient provided verbal/written consent for left [facility] Joint injection procedure with ultrasound guidance. Ultrasound was used to identify the [facility] joint and was used to guide this procedure. Patient was sterilely prepped in the usual fashion with chlorhexidine . Sterile ultrasound gel was used for the procedure. Following topical anesthetization with ethyl chloride, the patient was injected into the joint with a solution of  1cc 30mg  toradol  and 0.25cc 2% Mepivicaine. This was well visualized under ultrasound, please see associated photographic documentation. Patient tolerated well without complication. Precautions provided. Cleaned and dressing applied.   Intra-articular Knee Injection with Ultrasound Guidance Procedure Note Markiesha Delia [birth year] Indications: Pain Procedure Details Following the description of risks including infection, bleeding, damage to surrounding structures, patient provided written consent for right knee joint injection procedure. US  was used to identify the suprapatellar pouch. Patient was sterilely prepped in the usual fashion with alcohol.  Following topical anesthetization with ethyl chloride they were injected with a solution of 40mg  Depo-medrol and 3cc Mepivacaine 2% Lidocaine  via the superolateral approach into the suprapatellar pouch. This was well visualized under ultrasound, please see associated photographic documentation. Patient tolerated well without complication.  Precautions provided. Cleaned and dressing applied.      Assessment & Plan:   Assessment & Plan  After an extensive discussion regarding the risks and benefits of intra-articular Toradol  injection in the context of her being on Eliquis  and discussion of the results of Yiu et al's 2017 [facility] study in addition to the very high bioavailability of Toradol  after intra-articular administration, I believe that there is a small but distinct risk of complication after this injection related to increased bleeding risk.  After discussing this with the patient, she elected to proceed with this today.  We also proceeded with an intra-articular corticosteroid injection of her right knee for known osteoarthritis.  Patient desires to follow-up for an intra-articular corticosteroid injection of her right [facility] joint which I believe is reasonable but recommend waiting at least 1 week for.

## 2023-11-18 ENCOUNTER — Ambulatory Visit

## 2023-11-19 ENCOUNTER — Other Ambulatory Visit: Payer: Self-pay | Admitting: Urgent Care

## 2023-11-19 DIAGNOSIS — M51369 Other intervertebral disc degeneration, lumbar region without mention of lumbar back pain or lower extremity pain: Secondary | ICD-10-CM

## 2023-11-19 DIAGNOSIS — I2609 Other pulmonary embolism with acute cor pulmonale: Secondary | ICD-10-CM

## 2023-11-19 DIAGNOSIS — M810 Age-related osteoporosis without current pathological fracture: Secondary | ICD-10-CM

## 2023-11-19 DIAGNOSIS — F39 Unspecified mood [affective] disorder: Secondary | ICD-10-CM

## 2023-11-19 NOTE — Telephone Encounter (Unsigned)
 Copied from CRM [id number]. Topic: Clinical - Medication Refill >> Nov 19, 2023  3:16 PM Lauren C wrote: Medication:  apixaban  (ELIQUIS ) 5 MG TABS tablet  buPROPion  (WELLBUTRIN  XL) 150 MG 24 hr tablet  Calcium  Carbonate-Vit D-Min (CALCIUM  600+D PLUS MINERALS) 600-400 MG-UNIT TABS  gabapentin  (NEURONTIN ) 300 MG capsule  ipratropium (ATROVENT ) 0.03 % nasal spray  sertraline  (ZOLOFT ) 25 MG tablet   Has the patient contacted their pharmacy? Yes Received notice in the mail of issues with her refills since Dr. ONEIDA left.  This is the patient's preferred pharmacy:  [facility] - [place], [place] - [address], [place] [address] Phone: [phone]  Fax: [phone]    Is this the correct pharmacy for this prescription? Yes If no, delete pharmacy and type the correct one.   Has the prescription been filled recently? Yes  Is the patient out of the medication? No  Has the patient been seen for an appointment in the last year OR does the patient have an upcoming appointment? Yes, scheduled with Crain for 12/10  Can we respond through MyChart? No  Agent: Please be advised that Rx refills may take up to 3 business days. We ask that you follow-up with your pharmacy.

## 2023-11-20 MED ORDER — SERTRALINE HCL 25 MG PO TABS
25.0000 mg | ORAL_TABLET | Freq: Every day | ORAL | 3 refills | Status: AC
Start: 2023-11-20 — End: ?

## 2023-11-20 MED ORDER — BUPROPION HCL ER (XL) 150 MG PO TB24: 150.0000 mg | ORAL_TABLET | ORAL | 3 refills | Status: AC

## 2023-11-20 MED ORDER — CALCIUM 600+D PLUS MINERALS 600-400 MG-UNIT PO TABS: ORAL_TABLET | ORAL | 3 refills | Status: AC

## 2023-11-20 MED ORDER — IPRATROPIUM BROMIDE 0.03 % NA SOLN: 2.0000 | Freq: Two times a day (BID) | NASAL | 0 refills | Status: AC

## 2023-11-20 MED ORDER — APIXABAN 5 MG PO TABS: 5.0000 mg | ORAL_TABLET | Freq: Two times a day (BID) | ORAL | 3 refills | Status: AC

## 2023-11-20 MED ORDER — GABAPENTIN 300 MG PO CAPS
300.0000 mg | ORAL_CAPSULE | Freq: Two times a day (BID) | ORAL | 3 refills | Status: AC
Start: 2023-11-20 — End: ?

## 2023-11-26 ENCOUNTER — Ambulatory Visit

## 2023-11-26 NOTE — Progress Notes (Deleted)
   Subjective:    Patient ID: Vanessa Snyder, female    DOB: 63 y.o., 1960-12-08   MRN: [id number]  Chief Complaint: Right knee osteoarthritis injection  Discussed the use of AI scribe software for clinical note transcription with the patient, who gave verbal consent to proceed.  History of Present Illness        Objective:   There were no vitals filed for this visit.  Right knee: Medial joint line tenderness.     Assessment & Plan:   Assessment & Plan

## 2023-12-23 DIAGNOSIS — R911 Solitary pulmonary nodule: Secondary | ICD-10-CM | POA: Diagnosis not present

## 2023-12-23 DIAGNOSIS — Z86711 Personal history of pulmonary embolism: Secondary | ICD-10-CM | POA: Diagnosis not present

## 2023-12-23 DIAGNOSIS — Z0000 Encounter for general adult medical examination without abnormal findings: Secondary | ICD-10-CM | POA: Diagnosis not present

## 2023-12-23 DIAGNOSIS — R413 Other amnesia: Secondary | ICD-10-CM | POA: Diagnosis not present

## 2023-12-30 ENCOUNTER — Encounter: Payer: Self-pay | Admitting: Sports Medicine

## 2023-12-30 ENCOUNTER — Other Ambulatory Visit: Payer: Self-pay | Admitting: Sports Medicine

## 2023-12-30 DIAGNOSIS — R911 Solitary pulmonary nodule: Secondary | ICD-10-CM

## 2024-01-07 ENCOUNTER — Other Ambulatory Visit

## 2024-01-07 DIAGNOSIS — R911 Solitary pulmonary nodule: Secondary | ICD-10-CM

## 2024-01-07 DIAGNOSIS — R918 Other nonspecific abnormal finding of lung field: Secondary | ICD-10-CM | POA: Diagnosis not present

## 2024-01-07 DIAGNOSIS — J8410 Pulmonary fibrosis, unspecified: Secondary | ICD-10-CM | POA: Diagnosis not present

## 2024-01-07 DIAGNOSIS — D3501 Benign neoplasm of right adrenal gland: Secondary | ICD-10-CM | POA: Diagnosis not present

## 2024-01-07 DIAGNOSIS — D3502 Benign neoplasm of left adrenal gland: Secondary | ICD-10-CM | POA: Diagnosis not present

## 2024-01-08 ENCOUNTER — Encounter: Admitting: Urgent Care

## 2024-01-08 ENCOUNTER — Encounter: Payer: 59 | Admitting: Sports Medicine

## 2024-01-08 DIAGNOSIS — E782 Mixed hyperlipidemia: Secondary | ICD-10-CM | POA: Diagnosis not present

## 2024-01-15 ENCOUNTER — Ambulatory Visit: Payer: Self-pay | Admitting: Sports Medicine
